# Patient Record
Sex: Female | Born: 1935
Health system: Southern US, Community
[De-identification: ages and names within clinical notes are randomized; demographics above are authoritative.]

## PROBLEM LIST (undated history)

## (undated) DIAGNOSIS — M545 Low back pain, unspecified: Secondary | ICD-10-CM

## (undated) DIAGNOSIS — F32A Depression, unspecified: Secondary | ICD-10-CM

## (undated) DIAGNOSIS — R1013 Epigastric pain: Secondary | ICD-10-CM

## (undated) DIAGNOSIS — R112 Nausea with vomiting, unspecified: Secondary | ICD-10-CM

## (undated) DIAGNOSIS — F419 Anxiety disorder, unspecified: Secondary | ICD-10-CM

## (undated) DIAGNOSIS — G4700 Insomnia, unspecified: Secondary | ICD-10-CM

## (undated) DIAGNOSIS — IMO0002 Reserved for concepts with insufficient information to code with codable children: Secondary | ICD-10-CM

## (undated) DIAGNOSIS — E119 Type 2 diabetes mellitus without complications: Secondary | ICD-10-CM

## (undated) DIAGNOSIS — J309 Allergic rhinitis, unspecified: Secondary | ICD-10-CM

## (undated) DIAGNOSIS — R519 Headache, unspecified: Secondary | ICD-10-CM

## (undated) DIAGNOSIS — I10 Essential (primary) hypertension: Secondary | ICD-10-CM

## (undated) DIAGNOSIS — M109 Gout, unspecified: Secondary | ICD-10-CM

## (undated) DIAGNOSIS — F329 Major depressive disorder, single episode, unspecified: Secondary | ICD-10-CM

## (undated) DIAGNOSIS — Z9289 Personal history of other medical treatment: Secondary | ICD-10-CM

## (undated) DIAGNOSIS — H8090 Unspecified otosclerosis, unspecified ear: Secondary | ICD-10-CM

## (undated) DIAGNOSIS — R51 Headache: Secondary | ICD-10-CM

## (undated) DIAGNOSIS — M5430 Sciatica, unspecified side: Secondary | ICD-10-CM

## (undated) DIAGNOSIS — K5790 Diverticulosis of intestine, part unspecified, without perforation or abscess without bleeding: Secondary | ICD-10-CM

## (undated) DIAGNOSIS — H9313 Tinnitus, bilateral: Secondary | ICD-10-CM

## (undated) DIAGNOSIS — Z9889 Other specified postprocedural states: Secondary | ICD-10-CM

## (undated) HISTORY — PX: STAPEDECTOMY: SHX2435

## (undated) HISTORY — PX: EYE SURGERY: SHX253

## (undated) HISTORY — PX: TOTAL ABDOMINAL HYSTERECTOMY: SHX209

## (undated) HISTORY — DX: Tinnitus, bilateral: H93.13

## (undated) HISTORY — DX: Diverticulosis of intestine, part unspecified, without perforation or abscess without bleeding: K57.90

## (undated) HISTORY — DX: Essential (primary) hypertension: I10

## (undated) HISTORY — PX: CHOLECYSTECTOMY: SHX55

## (undated) HISTORY — DX: Headache, unspecified: R51.9

## (undated) HISTORY — DX: Allergic rhinitis, unspecified: J30.9

## (undated) HISTORY — DX: Low back pain, unspecified: M54.50

## (undated) HISTORY — PX: APPENDECTOMY: SHX54

## (undated) HISTORY — DX: Headache: R51

## (undated) HISTORY — DX: Anxiety disorder, unspecified: F41.9

## (undated) HISTORY — DX: Low back pain: M54.5

## (undated) HISTORY — DX: Gout, unspecified: M10.9

## (undated) HISTORY — DX: Type 2 diabetes mellitus without complications: E11.9

## (undated) HISTORY — DX: Major depressive disorder, single episode, unspecified: F32.9

## (undated) HISTORY — DX: Reserved for concepts with insufficient information to code with codable children: IMO0002

## (undated) HISTORY — DX: Depression, unspecified: F32.A

## (undated) HISTORY — DX: Sciatica, unspecified side: M54.30

## (undated) HISTORY — DX: Unspecified otosclerosis, unspecified ear: H80.90

## (undated) HISTORY — PX: BREAST REDUCTION SURGERY: SHX8

## (undated) HISTORY — PX: TOTAL KNEE ARTHROPLASTY: SHX125

## (undated) HISTORY — DX: Epigastric pain: R10.13

## (undated) HISTORY — DX: Insomnia, unspecified: G47.00

---

## 1998-09-25 ENCOUNTER — Encounter: Payer: Self-pay | Admitting: Emergency Medicine

## 1998-09-25 ENCOUNTER — Emergency Department (HOSPITAL_COMMUNITY): Admission: EM | Admit: 1998-09-25 | Discharge: 1998-09-25 | Payer: Self-pay | Admitting: Emergency Medicine

## 1998-11-01 ENCOUNTER — Ambulatory Visit (HOSPITAL_COMMUNITY): Admission: RE | Admit: 1998-11-01 | Discharge: 1998-11-01 | Payer: Self-pay | Admitting: *Deleted

## 1998-11-01 ENCOUNTER — Encounter: Payer: Self-pay | Admitting: *Deleted

## 1999-02-09 ENCOUNTER — Other Ambulatory Visit: Admission: RE | Admit: 1999-02-09 | Discharge: 1999-02-09 | Payer: Self-pay | Admitting: *Deleted

## 1999-11-09 ENCOUNTER — Encounter: Admission: RE | Admit: 1999-11-09 | Discharge: 1999-11-09 | Payer: Self-pay | Admitting: *Deleted

## 1999-11-09 ENCOUNTER — Encounter: Payer: Self-pay | Admitting: *Deleted

## 2000-11-09 ENCOUNTER — Encounter: Admission: RE | Admit: 2000-11-09 | Discharge: 2000-11-09 | Payer: Self-pay | Admitting: Internal Medicine

## 2000-11-09 ENCOUNTER — Encounter: Payer: Self-pay | Admitting: Internal Medicine

## 2001-11-14 ENCOUNTER — Encounter: Admission: RE | Admit: 2001-11-14 | Discharge: 2001-11-14 | Payer: Self-pay | Admitting: Internal Medicine

## 2001-11-14 ENCOUNTER — Encounter: Payer: Self-pay | Admitting: Internal Medicine

## 2002-09-10 ENCOUNTER — Encounter: Payer: Self-pay | Admitting: Emergency Medicine

## 2002-09-10 ENCOUNTER — Emergency Department (HOSPITAL_COMMUNITY): Admission: EM | Admit: 2002-09-10 | Discharge: 2002-09-10 | Payer: Self-pay | Admitting: Emergency Medicine

## 2002-12-12 ENCOUNTER — Encounter: Admission: RE | Admit: 2002-12-12 | Discharge: 2002-12-12 | Payer: Self-pay | Admitting: Internal Medicine

## 2002-12-12 ENCOUNTER — Encounter: Payer: Self-pay | Admitting: Internal Medicine

## 2003-10-16 ENCOUNTER — Inpatient Hospital Stay (HOSPITAL_COMMUNITY): Admission: RE | Admit: 2003-10-16 | Discharge: 2003-10-21 | Payer: Self-pay | Admitting: Orthopedic Surgery

## 2004-03-02 ENCOUNTER — Encounter: Admission: RE | Admit: 2004-03-02 | Discharge: 2004-03-02 | Payer: Self-pay | Admitting: Internal Medicine

## 2005-04-23 ENCOUNTER — Encounter: Admission: RE | Admit: 2005-04-23 | Discharge: 2005-04-23 | Payer: Self-pay | Admitting: Internal Medicine

## 2006-04-26 ENCOUNTER — Encounter: Admission: RE | Admit: 2006-04-26 | Discharge: 2006-04-26 | Payer: Self-pay | Admitting: Internal Medicine

## 2006-04-26 ENCOUNTER — Encounter: Payer: Self-pay | Admitting: Internal Medicine

## 2006-05-04 ENCOUNTER — Encounter: Admission: RE | Admit: 2006-05-04 | Discharge: 2006-05-04 | Payer: Self-pay | Admitting: Internal Medicine

## 2008-03-05 ENCOUNTER — Encounter: Admission: RE | Admit: 2008-03-05 | Discharge: 2008-03-05 | Payer: Self-pay | Admitting: Internal Medicine

## 2009-06-13 ENCOUNTER — Encounter: Admission: RE | Admit: 2009-06-13 | Discharge: 2009-06-13 | Payer: Self-pay | Admitting: Internal Medicine

## 2010-06-14 ENCOUNTER — Encounter: Payer: Self-pay | Admitting: Internal Medicine

## 2010-10-05 ENCOUNTER — Other Ambulatory Visit: Payer: Self-pay | Admitting: Internal Medicine

## 2010-10-05 DIAGNOSIS — Z1231 Encounter for screening mammogram for malignant neoplasm of breast: Secondary | ICD-10-CM

## 2010-10-09 NOTE — Op Note (Signed)
NAME:  Haley Campbell, Haley Campbell                            ACCOUNT NO.:  [id number]   MEDICAL RECORD NO.:  [id number]                   PATIENT TYPE:  INP   LOCATION:  5025                                 FACILITY:  [facility]   PHYSICIAN:  Elana Alm. Thurston Hole, M.D.              DATE OF BIRTH:  June 22, 1923   DATE OF PROCEDURE:  10/17/2003  DATE OF DISCHARGE:                                 OPERATIVE REPORT   PREOPERATIVE DIAGNOSIS:  Right knee degenerative joint disease.   POSTOPERATIVE DIAGNOSIS:  Right knee degenerative joint disease.   OPERATION PERFORMED:  Right total knee replacement using [employer]ics Scorpio  total knee system with #7 cemented femur, #5 cemented tibia with 12 mm  polyethylene Flex tibial spacer and 26 mm polyethylene cemented patella.   SURGEON:  Elana Alm. Thurston Hole, M.D.   ASSISTANT:  Julien Girt, P.A.   ANESTHESIA:  General.   OPERATIVE TIME:  One hour and 20 minutes.   COMPLICATIONS:  None   DESCRIPTION OF PROCEDURE:  Haley Campbell was brought to the operating room on Oct 16, 2003 after a femoral nerve block had been placed in the holding room by  anesthesia.  She was placed on the operating table in supine position.  She  received Ancef 1 g IV preoperatively for prophylaxis.  After being placed  under general anesthesia, she had a Foley catheter placed under sterile  conditions.  Her right knee was examined under anesthesia.  Range of motion  was -5 to 125 degrees with mild varus deformity.  Knee stable to ligamentous  exam with normal patellar tracking.  The right knee was prepped using  sterile DuraPrep and draped using sterile technique.  The leg was  exsanguinated and a thigh tourniquet elevated to 350 mmHg.  Initially,  through a 15 cm longitudinal incision based over the patella, initial  exposure was made.  The underlying subcutaneous tissues were incised in line  with the skin incision.  A median arthrotomy was performed revealing an  excessive amount of  normal-appearing joint fluid.  The articular surfaces  were inspected.  She had grade 4 changes medially, grade 3 changes laterally  and grade 3 and 4 changes in the patellofemoral joint.  The medial and  lateral meniscal remnants were removed as well as the anterior cruciate  ligament.  Osteophytes were removed from the femoral condyles and tibial  plateau.  Intramedullary drill was then drilled up the femoral canal for  placement of a distal femoral cutting jig which was placed in the  appropriate amount of rotation and a distal 10 mm cut was made.  The distal  femur was then sized.  #7 was found to be the appropriate size.  A #7  cutting jig was placed and then these cuts were made.  The proximal tibia  was then exposed.  The tibial spines were removed with an oscillating saw.  Intramedullary drill,  drilled down the tibial canal for placement of the  proximal tibial cutting jig which was placed in the appropriate amount of  rotation and a proximal 6 mm cut was made.  After this was done, the Scorpio  PCL cutter was placed back on the distal femur and these cuts were made.  At  this point the #7 femoral trial was placed.  The #5 tibial base plate trial  was placed over a 12 mm polyethylene spacer.  There was found to be  excellent restoration of normal alignment and excellent stability.  Range of  motion 0 to 125 degrees.  The tibial base plate was then marked for rotation  and the keel cut was made. After this was done, the patella was sized.  A 26  mm was found to be the appropriate size and a recessed 10 mm x 26 mm cut was  made and three locking holes were placed.  After this was done, it was felt  that all of the trial components were if excellent size fit and stability.  They were then removed and the knee was then jet lavage irrigated with 3 L  of saline solution.  The proximal tibia was then exposed.  A #5 tibial base  plate with cement backing was then hammered in position with an  excellent  fit with excess cement being removed from around the edges. The #7 femoral  component with cement backing was hammered into position, also with cement  backing and excess cement removed from around the edges.  The 12 mm  polyethylene Flex tibial spacer was then locked on the tibial base plate,  knee taken through a range of motion 0 to 125 degrees with excellent  stability, excellent correction of her varus deformity.  The 26 mm  polyethylene cement backed patella was then locked into its recess hole and  held there with a clamp.  After the cement hardened, patellofemoral tracking  was evaluated and this was found to be normal.  At this point it was felt  that all the components were of excellent size, fit and stability.  The knee  was further irrigated with saline and then the arthrotomy was closed with #1  Ethibond suture over two medium Hemovac drains.  Subcutaneous tissue closed  with 0 and 2-0 Vicryl.  Skin closed with skin staples.  Sterile dressings  were applied.  The Hemovac injected with 0.25% Marcaine with epinephrine and  clamped.  Tourniquet was released.  Sterile dressings and a long leg splint  applied.  The patient was awakened, extubated and taken to recovery room in  stable condition.  Sponge and needle counts were correct times two at the  end of this case.                                               Robert A. Thurston Hole, M.D.    RAW/MEDQ  D:  10/17/2003  T:  10/17/2003  Job:  [id number]

## 2010-10-09 NOTE — Discharge Summary (Signed)
NAME:  Haley Campbell, Haley Campbell                            ACCOUNT NO.:  [id number]   MEDICAL RECORD NO.:  [id number]                   PATIENT TYPE:  INP   LOCATION:  5025                                 FACILITY:  [facility]   PHYSICIAN:  Elana Alm. Thurston Hole, M.D.              DATE OF BIRTH:  Aug 04, 1923   DATE OF ADMISSION:  10/16/2003  DATE OF DISCHARGE:  10/21/2003                                 DISCHARGE SUMMARY   ADMISSION DIAGNOSES:  1. End-stage degenerative joint disease, right knee.  2. Hypertension.  3. Glaucoma.   DISCHARGE DIAGNOSES:  1. End-stage degenerative joint disease, right knee.  2. Hypertension.  3. Glaucoma.  4. Postoperative blood loss anemia.   HISTORY OF PRESENT ILLNESS:  The patient is a 77 year old female who has a  history of end-stage degenerative joint disease of her right knee.  She has  tried anti-inflammatories, cortisone injections and conservative care, all  without success.  She understands the risks, benefits and possible  complications of a right total knee replacement, and is without questions.   PROCEDURE IN-HOUSE:  On Oct 16, 2003, the patient underwent a right total  knee replacement by Dr. Elana Alm. Wainer.  Postoperatively she had a right  femoral nerve block by anesthesia.  She tolerated both procedures well.  On  postoperative day one, the patient was doing well.  She did have nausea on  the day before.  Her surgical wound was well-approximated.  He hemoglobin  was 10.3.  Her urinalysis was negative.  On postoperative day two, pain medicine was switched to Dilaudid due to  nausea and increased pain.  She tolerated this better.  Her surgical wound  was well-approximated.  Her hemoglobin was 9.6.  Her Foley catheter was  discontinued.  Her physical therapy was progressed.  On postoperative day three the patient did better on Dilaudid for pain.  Her  hemoglobin was 8.9.  The surgical wound was well-approximated.  A Doppler  was obtained to rule out a  deep vein thrombosis.  The patient requested  Darvocet for pain, despite the fact that Dilaudid was barely controlling her  pain.  She was given this and it did not help.  Her PCA was restarted with  the Dilaudid PCA.  Her hemoglobin was 7.6.  She was transfused 2 units of  packed red blood cells due to her postoperative blood loss anemia.  On postoperative day five the patient was discharged to home on p.o.  Dilaudid for pain.  Her INR was 1.8.  Her hemoglobin was 10.2.  Her surgical  wound was well-approximated.   DISCHARGE MEDICATIONS:  1. Dilaudid 2 mg, one to two q.4h. p.r.n. pain.  2. Robaxin 500 mg, one q.6h. p.r.n. muscle spasm.  3. Coumadin 2 mg, one tab q. Evening.  4. Lisinopril 20 mg, one tab daily.  5. Alprazolam 0.5 mg, one tab q.8h. p.r.n. anxiety.  6. Xalatan  0.0005%, one drop in each eye in the evening.  7. Timoptic 0.5 mg, one drop in each eye in the morning.  8. Colace 100 mg, one tab b.i.d.  9. Senokot S, two tab before dinner.   DISCHARGE INSTRUCTIONS:  1. To never place a pillow under the bend of the knee.  2. Every morning she is to elevate her right heel on a folded pillow for 30     minutes, to work on extension of her knee.  3. She has been instructed to change her dressing every day.  4. She is to use her CPM 0-60 degrees eight hours a day, increasing by 5     degrees a day until she reaches 90 degrees, and then continue using it     eight hours a day.  5. She is weightbearing as tolerated.  6. She is on a regular diet.  7. She is to wear a knee immobilizer when she is walking.  8. She is to call Dr. Thurston Hole if she has increased redness, increased     swelling, increased pain, or a temperature of greater than 101 degrees.   FOLLOWUP:  She will follow up in the office in 1-1/2 weeks, or sooner if she  has problems.      Kirstin Shepperson, P.A.                  Robert A. Thurston Hole, M.D.    KS/MEDQ  D:  12/17/2003  T:  12/17/2003  Job:  [id number]

## 2010-10-14 ENCOUNTER — Ambulatory Visit
Admission: RE | Admit: 2010-10-14 | Discharge: 2010-10-14 | Disposition: A | Discharge status: Home / Self Care | Payer: Medicare Other | Source: Ambulatory Visit | Attending: Internal Medicine | Admitting: Internal Medicine

## 2010-10-14 DIAGNOSIS — Z1231 Encounter for screening mammogram for malignant neoplasm of breast: Secondary | ICD-10-CM

## 2011-06-23 DIAGNOSIS — K5732 Diverticulitis of large intestine without perforation or abscess without bleeding: Secondary | ICD-10-CM | POA: Diagnosis not present

## 2011-06-23 DIAGNOSIS — R1032 Left lower quadrant pain: Secondary | ICD-10-CM | POA: Diagnosis not present

## 2011-06-23 DIAGNOSIS — G4700 Insomnia, unspecified: Secondary | ICD-10-CM | POA: Diagnosis not present

## 2011-06-23 DIAGNOSIS — I10 Essential (primary) hypertension: Secondary | ICD-10-CM | POA: Diagnosis not present

## 2011-06-23 DIAGNOSIS — F411 Generalized anxiety disorder: Secondary | ICD-10-CM | POA: Diagnosis not present

## 2011-06-23 DIAGNOSIS — E559 Vitamin D deficiency, unspecified: Secondary | ICD-10-CM | POA: Diagnosis not present

## 2011-06-23 DIAGNOSIS — H9190 Unspecified hearing loss, unspecified ear: Secondary | ICD-10-CM | POA: Diagnosis not present

## 2011-06-30 DIAGNOSIS — H6120 Impacted cerumen, unspecified ear: Secondary | ICD-10-CM | POA: Diagnosis not present

## 2011-06-30 DIAGNOSIS — H908 Mixed conductive and sensorineural hearing loss, unspecified: Secondary | ICD-10-CM | POA: Diagnosis not present

## 2011-06-30 DIAGNOSIS — H9319 Tinnitus, unspecified ear: Secondary | ICD-10-CM | POA: Diagnosis not present

## 2011-07-28 DIAGNOSIS — H409 Unspecified glaucoma: Secondary | ICD-10-CM | POA: Diagnosis not present

## 2011-07-28 DIAGNOSIS — H4011X0 Primary open-angle glaucoma, stage unspecified: Secondary | ICD-10-CM | POA: Diagnosis not present

## 2011-08-18 DIAGNOSIS — I10 Essential (primary) hypertension: Secondary | ICD-10-CM | POA: Diagnosis not present

## 2011-08-18 DIAGNOSIS — G4700 Insomnia, unspecified: Secondary | ICD-10-CM | POA: Diagnosis not present

## 2011-08-18 DIAGNOSIS — E559 Vitamin D deficiency, unspecified: Secondary | ICD-10-CM | POA: Diagnosis not present

## 2011-08-18 DIAGNOSIS — J309 Allergic rhinitis, unspecified: Secondary | ICD-10-CM | POA: Diagnosis not present

## 2011-08-18 DIAGNOSIS — M76899 Other specified enthesopathies of unspecified lower limb, excluding foot: Secondary | ICD-10-CM | POA: Diagnosis not present

## 2011-08-18 DIAGNOSIS — H9190 Unspecified hearing loss, unspecified ear: Secondary | ICD-10-CM | POA: Diagnosis not present

## 2011-08-18 DIAGNOSIS — F411 Generalized anxiety disorder: Secondary | ICD-10-CM | POA: Diagnosis not present

## 2012-01-13 DIAGNOSIS — H4011X0 Primary open-angle glaucoma, stage unspecified: Secondary | ICD-10-CM | POA: Diagnosis not present

## 2012-01-13 DIAGNOSIS — H409 Unspecified glaucoma: Secondary | ICD-10-CM | POA: Diagnosis not present

## 2012-02-09 DIAGNOSIS — H4011X0 Primary open-angle glaucoma, stage unspecified: Secondary | ICD-10-CM | POA: Diagnosis not present

## 2012-02-09 DIAGNOSIS — H409 Unspecified glaucoma: Secondary | ICD-10-CM | POA: Diagnosis not present

## 2012-02-10 DIAGNOSIS — Z23 Encounter for immunization: Secondary | ICD-10-CM | POA: Diagnosis not present

## 2012-02-21 DIAGNOSIS — G4700 Insomnia, unspecified: Secondary | ICD-10-CM | POA: Diagnosis not present

## 2012-02-21 DIAGNOSIS — J3489 Other specified disorders of nose and nasal sinuses: Secondary | ICD-10-CM | POA: Diagnosis not present

## 2012-02-21 DIAGNOSIS — M1710 Unilateral primary osteoarthritis, unspecified knee: Secondary | ICD-10-CM | POA: Diagnosis not present

## 2012-02-21 DIAGNOSIS — Z79899 Other long term (current) drug therapy: Secondary | ICD-10-CM | POA: Diagnosis not present

## 2012-02-21 DIAGNOSIS — I10 Essential (primary) hypertension: Secondary | ICD-10-CM | POA: Diagnosis not present

## 2012-02-21 DIAGNOSIS — M169 Osteoarthritis of hip, unspecified: Secondary | ICD-10-CM | POA: Diagnosis not present

## 2012-02-21 DIAGNOSIS — E559 Vitamin D deficiency, unspecified: Secondary | ICD-10-CM | POA: Diagnosis not present

## 2012-02-21 DIAGNOSIS — J309 Allergic rhinitis, unspecified: Secondary | ICD-10-CM | POA: Diagnosis not present

## 2012-03-30 DIAGNOSIS — H902 Conductive hearing loss, unspecified: Secondary | ICD-10-CM | POA: Diagnosis not present

## 2012-03-30 DIAGNOSIS — H903 Sensorineural hearing loss, bilateral: Secondary | ICD-10-CM | POA: Diagnosis not present

## 2012-04-27 DIAGNOSIS — Z79899 Other long term (current) drug therapy: Secondary | ICD-10-CM | POA: Diagnosis not present

## 2012-04-27 DIAGNOSIS — H9319 Tinnitus, unspecified ear: Secondary | ICD-10-CM | POA: Diagnosis not present

## 2012-04-27 DIAGNOSIS — H9190 Unspecified hearing loss, unspecified ear: Secondary | ICD-10-CM | POA: Diagnosis not present

## 2012-05-30 DIAGNOSIS — F411 Generalized anxiety disorder: Secondary | ICD-10-CM | POA: Diagnosis not present

## 2012-05-30 DIAGNOSIS — M5430 Sciatica, unspecified side: Secondary | ICD-10-CM | POA: Diagnosis not present

## 2012-05-30 DIAGNOSIS — G4700 Insomnia, unspecified: Secondary | ICD-10-CM | POA: Diagnosis not present

## 2012-05-30 DIAGNOSIS — I10 Essential (primary) hypertension: Secondary | ICD-10-CM | POA: Diagnosis not present

## 2012-08-07 DIAGNOSIS — Z0000 Encounter for general adult medical examination without abnormal findings: Secondary | ICD-10-CM | POA: Diagnosis not present

## 2012-08-07 DIAGNOSIS — I10 Essential (primary) hypertension: Secondary | ICD-10-CM | POA: Diagnosis not present

## 2012-09-08 DIAGNOSIS — S62319A Displaced fracture of base of unspecified metacarpal bone, initial encounter for closed fracture: Secondary | ICD-10-CM | POA: Diagnosis not present

## 2012-09-08 DIAGNOSIS — S139XXA Sprain of joints and ligaments of unspecified parts of neck, initial encounter: Secondary | ICD-10-CM | POA: Diagnosis not present

## 2012-09-08 DIAGNOSIS — S025XXA Fracture of tooth (traumatic), initial encounter for closed fracture: Secondary | ICD-10-CM | POA: Diagnosis not present

## 2012-09-08 DIAGNOSIS — S199XXA Unspecified injury of neck, initial encounter: Secondary | ICD-10-CM | POA: Diagnosis not present

## 2012-09-08 DIAGNOSIS — S62309A Unspecified fracture of unspecified metacarpal bone, initial encounter for closed fracture: Secondary | ICD-10-CM | POA: Diagnosis not present

## 2012-09-08 DIAGNOSIS — S01501A Unspecified open wound of lip, initial encounter: Secondary | ICD-10-CM | POA: Diagnosis not present

## 2012-09-08 DIAGNOSIS — S0990XA Unspecified injury of head, initial encounter: Secondary | ICD-10-CM | POA: Diagnosis not present

## 2012-09-08 DIAGNOSIS — S0003XA Contusion of scalp, initial encounter: Secondary | ICD-10-CM | POA: Diagnosis not present

## 2012-09-08 DIAGNOSIS — S1093XA Contusion of unspecified part of neck, initial encounter: Secondary | ICD-10-CM | POA: Diagnosis not present

## 2012-09-08 DIAGNOSIS — S0993XA Unspecified injury of face, initial encounter: Secondary | ICD-10-CM | POA: Diagnosis not present

## 2012-09-11 DIAGNOSIS — S62309A Unspecified fracture of unspecified metacarpal bone, initial encounter for closed fracture: Secondary | ICD-10-CM | POA: Diagnosis not present

## 2012-09-21 DIAGNOSIS — IMO0001 Reserved for inherently not codable concepts without codable children: Secondary | ICD-10-CM | POA: Diagnosis not present

## 2012-09-27 DIAGNOSIS — H409 Unspecified glaucoma: Secondary | ICD-10-CM | POA: Diagnosis not present

## 2012-10-05 DIAGNOSIS — IMO0001 Reserved for inherently not codable concepts without codable children: Secondary | ICD-10-CM | POA: Diagnosis not present

## 2012-10-19 DIAGNOSIS — S5290XD Unspecified fracture of unspecified forearm, subsequent encounter for closed fracture with routine healing: Secondary | ICD-10-CM | POA: Diagnosis not present

## 2012-11-02 DIAGNOSIS — Z4789 Encounter for other orthopedic aftercare: Secondary | ICD-10-CM | POA: Diagnosis not present

## 2012-11-08 DIAGNOSIS — S62319A Displaced fracture of base of unspecified metacarpal bone, initial encounter for closed fracture: Secondary | ICD-10-CM | POA: Diagnosis not present

## 2012-11-15 DIAGNOSIS — S62319A Displaced fracture of base of unspecified metacarpal bone, initial encounter for closed fracture: Secondary | ICD-10-CM | POA: Diagnosis not present

## 2012-11-20 DIAGNOSIS — S62319A Displaced fracture of base of unspecified metacarpal bone, initial encounter for closed fracture: Secondary | ICD-10-CM | POA: Diagnosis not present

## 2012-11-28 DIAGNOSIS — S62319A Displaced fracture of base of unspecified metacarpal bone, initial encounter for closed fracture: Secondary | ICD-10-CM | POA: Diagnosis not present

## 2013-02-07 DIAGNOSIS — R51 Headache: Secondary | ICD-10-CM | POA: Diagnosis not present

## 2013-02-07 DIAGNOSIS — H8000 Otosclerosis involving oval window, nonobliterative, unspecified ear: Secondary | ICD-10-CM | POA: Diagnosis not present

## 2013-02-07 DIAGNOSIS — F411 Generalized anxiety disorder: Secondary | ICD-10-CM | POA: Diagnosis not present

## 2013-02-07 DIAGNOSIS — M5430 Sciatica, unspecified side: Secondary | ICD-10-CM | POA: Diagnosis not present

## 2013-02-13 DIAGNOSIS — Z23 Encounter for immunization: Secondary | ICD-10-CM | POA: Diagnosis not present

## 2013-03-09 DIAGNOSIS — H8000 Otosclerosis involving oval window, nonobliterative, unspecified ear: Secondary | ICD-10-CM | POA: Diagnosis not present

## 2013-03-09 DIAGNOSIS — M76899 Other specified enthesopathies of unspecified lower limb, excluding foot: Secondary | ICD-10-CM | POA: Diagnosis not present

## 2013-03-09 DIAGNOSIS — M5430 Sciatica, unspecified side: Secondary | ICD-10-CM | POA: Diagnosis not present

## 2013-03-09 DIAGNOSIS — F411 Generalized anxiety disorder: Secondary | ICD-10-CM | POA: Diagnosis not present

## 2013-03-09 DIAGNOSIS — R51 Headache: Secondary | ICD-10-CM | POA: Diagnosis not present

## 2013-03-22 DIAGNOSIS — H902 Conductive hearing loss, unspecified: Secondary | ICD-10-CM | POA: Diagnosis not present

## 2013-03-22 DIAGNOSIS — H9319 Tinnitus, unspecified ear: Secondary | ICD-10-CM | POA: Diagnosis not present

## 2013-03-28 DIAGNOSIS — H47239 Glaucomatous optic atrophy, unspecified eye: Secondary | ICD-10-CM | POA: Diagnosis not present

## 2013-03-28 DIAGNOSIS — H409 Unspecified glaucoma: Secondary | ICD-10-CM | POA: Diagnosis not present

## 2013-06-07 DIAGNOSIS — J111 Influenza due to unidentified influenza virus with other respiratory manifestations: Secondary | ICD-10-CM | POA: Diagnosis not present

## 2013-08-13 DIAGNOSIS — Z1331 Encounter for screening for depression: Secondary | ICD-10-CM | POA: Diagnosis not present

## 2013-08-13 DIAGNOSIS — IMO0002 Reserved for concepts with insufficient information to code with codable children: Secondary | ICD-10-CM | POA: Diagnosis not present

## 2013-08-13 DIAGNOSIS — G4700 Insomnia, unspecified: Secondary | ICD-10-CM | POA: Diagnosis not present

## 2013-08-13 DIAGNOSIS — E119 Type 2 diabetes mellitus without complications: Secondary | ICD-10-CM | POA: Diagnosis not present

## 2013-08-13 DIAGNOSIS — M1710 Unilateral primary osteoarthritis, unspecified knee: Secondary | ICD-10-CM | POA: Diagnosis not present

## 2013-08-13 DIAGNOSIS — M109 Gout, unspecified: Secondary | ICD-10-CM | POA: Diagnosis not present

## 2013-08-13 DIAGNOSIS — Z0000 Encounter for general adult medical examination without abnormal findings: Secondary | ICD-10-CM | POA: Diagnosis not present

## 2013-08-13 DIAGNOSIS — R1032 Left lower quadrant pain: Secondary | ICD-10-CM | POA: Diagnosis not present

## 2013-08-13 DIAGNOSIS — E559 Vitamin D deficiency, unspecified: Secondary | ICD-10-CM | POA: Diagnosis not present

## 2013-08-13 DIAGNOSIS — I10 Essential (primary) hypertension: Secondary | ICD-10-CM | POA: Diagnosis not present

## 2013-08-13 DIAGNOSIS — H9190 Unspecified hearing loss, unspecified ear: Secondary | ICD-10-CM | POA: Diagnosis not present

## 2013-08-20 DIAGNOSIS — M545 Low back pain, unspecified: Secondary | ICD-10-CM | POA: Diagnosis not present

## 2013-08-20 DIAGNOSIS — M169 Osteoarthritis of hip, unspecified: Secondary | ICD-10-CM | POA: Diagnosis not present

## 2013-08-20 DIAGNOSIS — M1610 Unilateral primary osteoarthritis, unspecified hip: Secondary | ICD-10-CM | POA: Diagnosis not present

## 2013-08-30 DIAGNOSIS — M25559 Pain in unspecified hip: Secondary | ICD-10-CM | POA: Diagnosis not present

## 2013-10-04 DIAGNOSIS — M25559 Pain in unspecified hip: Secondary | ICD-10-CM | POA: Diagnosis not present

## 2013-10-22 ENCOUNTER — Encounter: Payer: Self-pay | Admitting: *Deleted

## 2013-10-23 ENCOUNTER — Ambulatory Visit (INDEPENDENT_AMBULATORY_CARE_PROVIDER_SITE_OTHER): Payer: Medicare Other | Admitting: Neurology

## 2013-10-23 ENCOUNTER — Encounter: Payer: Self-pay | Admitting: Neurology

## 2013-10-23 VITALS — BP 159/81 | HR 74 | Ht 62.75 in | Wt 150.0 lb

## 2013-10-23 DIAGNOSIS — H9190 Unspecified hearing loss, unspecified ear: Secondary | ICD-10-CM | POA: Diagnosis not present

## 2013-10-23 DIAGNOSIS — R51 Headache: Secondary | ICD-10-CM | POA: Diagnosis not present

## 2013-10-23 DIAGNOSIS — E538 Deficiency of other specified B group vitamins: Secondary | ICD-10-CM | POA: Diagnosis not present

## 2013-10-23 DIAGNOSIS — G459 Transient cerebral ischemic attack, unspecified: Secondary | ICD-10-CM | POA: Diagnosis not present

## 2013-10-23 DIAGNOSIS — R519 Headache, unspecified: Secondary | ICD-10-CM | POA: Insufficient documentation

## 2013-10-23 NOTE — Progress Notes (Signed)
[facility]    Provider:  Dr Hosie PoissonSumner Referring Provider: Marden NobleGates, Robert, MD Primary Care Physician:  Pearla DubonnetGATES,ROBERT NEVILL, MD  CC:  Blowing in her ears  HPI:  Haley GalloDoris G Campbell is a 80 y.o. female here as a referral from Dr. Kevan NyGates for headache and blowing sound in her ears  Symptoms started around 3 years ago. Symptoms start as a burning sensation on the top of her head, then develops a popping sound in her left ear and then gets a loud blowing sound that can continue for up to 24 hours, will resolve when she wakes up. Sleep is the only thing that gets rid of it. Events last a day and then will reoccur 2 days later, is on a set schedule for the past few years. After the episode can get a severe frontal squeezing headache. Has glaucoma and baseline poor visual acuity. In the past has had episodes of vertigo but these are brief and not tied to the above episodes.   Reports having had 3 MRI of the brain in the past which were normal. Has been evaluated by multiple ENT physicians in the past with no cause. Otherwise healthy.   Review of Systems: Out of a complete 14 system review, the patient complains of only the following symptoms, and all other reviewed systems are negative. + blurred vision, loss of vision, headache, insomnia, sleepiness, restless legs, difficulty swallowing  History   Social History  . Marital Status: Widowed    Spouse Name: N/A    Number of Children: N/A  . Years of Education: N/A   Occupational History  . Not on file.   Social History Main Topics  . Smoking status: Never Smoker   . Smokeless tobacco: Never Used  . Alcohol Use: No  . Drug Use: No  . Sexual Activity: Not on file   Other Topics Concern  . Not on file   Social History Narrative   Widowed, 2 children   Right handed   12 th grade   No caffeine    Family History  Problem Relation Age of Onset  . CAD Father   . CVA Mother   . Hypertension Brother   . Prostate cancer  Brother   . Cancer Sister     BRAIN TUMOR    Past Medical History  Diagnosis Date  . Tinnitus of both ears     Chronic severe, and mixed conductive and sensorineural hearing loss  . Hypertension   . Diverticulosis     with episodic diverticulitis  . Low back pain     Chronic  . Hypertension   . Insomnia     severe  . Gout   . Anxiety   . Depression   . HA (headache)     chronic  . Dyspepsia     persistent  . Degenerative disc disease     knees  . Allergic rhinitis   . Diabetes mellitus     type 2  . Otosclerosis     with left stapedectomy  . Sciatica     left lower extremity     Past Surgical History  Procedure Laterality Date  . Stapedectomy Left     middle ear  . Total knee arthroplasty Right   . Cholecystectomy    . Total abdominal hysterectomy    . Appendectomy    . Breast reduction surgery Bilateral     Current Outpatient Prescriptions  Medication Sig Dispense Refill  . ALPRAZolam (XANAX) 0.5 MG  tablet Take 0.5 mg by mouth at bedtime as needed for anxiety.      Marland Kitchen glucose blood test strip 1 each by Other route as needed for other. Use as instructed      . lisinopril (PRINIVIL,ZESTRIL) 10 MG tablet Take 10 mg by mouth daily.      Marland Kitchen METFORMIN HCL PO Take 500 mg by mouth.      . traMADol-acetaminophen (ULTRACET) 37.5-325 MG per tablet Take 1 tablet by mouth every 6 (six) hours as needed.      . zolpidem (AMBIEN) 10 MG tablet Take 10 mg by mouth at bedtime as needed for sleep.       No current facility-administered medications for this visit.    Allergies as of 10/23/2013 - Review Complete 10/23/2013  Allergen Reaction Noted  . Latex Other (See Comments) 10/22/2013  . Codeine Nausea Only 10/22/2013  . Colchicine Diarrhea and Nausea And Vomiting 10/22/2013  . Hctz [hydrochlorothiazide] Nausea Only and Other (See Comments) 10/22/2013  . Sonata [zaleplon] Nausea Only and Other (See Comments) 10/22/2013  . Sulfa antibiotics Rash 10/22/2013     Vitals: BP 159/81  Pulse 74  Ht 5' 2.75" (1.594 m)  Wt 150 lb (68.04 kg)  BMI 26.78 kg/m2 Last Weight:  Wt Readings from Last 1 Encounters:  10/23/13 150 lb (68.04 kg)   Last Height:   Ht Readings from Last 1 Encounters:  10/23/13 5' 2.75" (1.594 m)     Physical exam: Exam: Gen: NAD, conversant Eyes: anicteric sclerae, moist conjunctivae HENT: Atraumatic, oropharynx clear Neck: Trachea midline; supple,  Lungs: CTA, no wheezing, rales, rhonic                          CV: RRR, no MRG Abdomen: Soft, non-tender;  Extremities: No peripheral edema  Skin: Normal temperature, no rash,  Psych: Appropriate affect, pleasant  Neuro: MS: AA&Ox3, appropriately interactive, normal affect   Attention: WORLD backwards  Speech: fluent w/o paraphasic error  Memory: good recent and remote recall  CN: PERRL, EOMI no nystagmus, no ptosis, sensation intact to LT V1-V3 bilat, face symmetric, no weakness, palate elevates symmetrically, shoulder shrug 5/5 bilat,  tongue protrudes midline, no fasiculations noted.  Motor: normal bulk and tone Strength: 5/5  In all extremities  Coord: rapid alternating and point-to-point (FNF, HTS) movements intact.  Reflexes: symmetrical, bilat downgoing toes  Sens: LT intact in all extremities  Gait: posture, stance, stride and arm-swing normal. Tandem gait intact. Able to walk on heels and toes. Romberg absent.   Assessment:  After physical and neurologic examination, review of laboratory studies, imaging, neurophysiology testing and pre-existing records, assessment will be reviewed on the problem list.  Plan:  Treatment plan and additional workup will be reviewed under Problem List.  1)Headache 2)Abnormal sounds  80y/o woman presenting for initial evaluation of recurrent headaches and an abnormal "whooshing" sound in her ears. Unclear etiology of her symptoms. She has had multiple brain MRIs and ENT evaluations without a clear diagnosis.  New onset headache in a patient in their 75s raises question of GCA though this is atypical for GCA. Will check MRA brain, EEG and lab workup. Follow up once workup has been completed.    Elspeth Cho, DO  [facility] [address] [place], [place] [address]  Phone [phone] Fax [phone]

## 2013-10-23 NOTE — Addendum Note (Signed)
Addended by: Ramond Marrow on: 10/23/2013 01:46 PM   Modules accepted: Orders

## 2013-10-23 NOTE — Patient Instructions (Signed)
Overall you are doing fairly well but I do want to suggest a few things today:   As far as diagnostic testing:  1)Please have some blood work completed today 2)Please schedule an EEG for Thursday of this week 3)I would like you to have a MRA of the brain, you will be called to schedule this  Follow up once workup completed. Please call us with any interim questions, concerns, problems, updates or refill requests.   My clinical assistant and will answer any of your questions and relay your messages to me and also relay most of my messages to you.   Our phone number is [phone]. We also have an after hours call service for urgent matters and there is a physician on-call for urgent questions. For any emergencies you know to call 911 or go to the nearest emergency room

## 2013-10-24 LAB — VITAMIN B12: VITAMIN B 12: 324 pg/mL (ref 211–946)

## 2013-10-24 LAB — SEDIMENTATION RATE: Sed Rate: 22 mm/hr (ref 0–40)

## 2013-10-24 NOTE — Progress Notes (Signed)
Quick Note:  Spoke to patient informed her of normal labs, and to call back with any questions or concerns. ______

## 2013-10-25 ENCOUNTER — Ambulatory Visit (INDEPENDENT_AMBULATORY_CARE_PROVIDER_SITE_OTHER): Payer: Medicare Other | Admitting: Radiology

## 2013-10-25 DIAGNOSIS — R51 Headache: Secondary | ICD-10-CM

## 2013-10-25 DIAGNOSIS — H9190 Unspecified hearing loss, unspecified ear: Secondary | ICD-10-CM

## 2013-10-25 LAB — METHYLMALONIC ACID, SERUM: Methylmalonic Acid: 197 nmol/L (ref 0–378)

## 2013-10-25 NOTE — Procedures (Signed)
    History:  Haley Campbell is an 80 year old patient with a history of headache and a blowing sound in her ears. The patient is being evaluated for this event. The patient has had episodes over the last 3 years.  This is a routine EEG. No skull defects are noted. Medications include Xanax, lisinopril, metformin, Ultracet, and Ambien.   EEG classification: Normal awake  Description of the recording: The background rhythms of this recording consists of a fairly well modulated medium amplitude alpha rhythm of 9 Hz that is reactive to eye opening and closure. As the record progresses, the patient appears to remain in the waking state throughout the recording. Photic stimulation was performed, resulting in a bilateral and symmetric photic driving response. Hyperventilation was not performed. At no time during the recording does there appear to be evidence of spike or spike wave discharges or evidence of focal slowing. Throughout the recording, there is an overlying beta frequency activity. EKG monitor shows no evidence of cardiac rhythm abnormalities with a heart rate of 78.  Impression: This is a normal EEG recording in the waking state. No evidence of ictal or interictal discharges are seen. The persistent beta activity throughout the recording likely is an associated medication effect, likely associated with the use of benzodiazepines such as Xanax.

## 2013-10-26 NOTE — Progress Notes (Signed)
Quick Note:  Spoke with patient and informed her of normal EEG patient had no further questions or concerns and expressed understanding. ______

## 2013-11-03 ENCOUNTER — Ambulatory Visit
Admission: RE | Admit: 2013-11-03 | Discharge: 2013-11-03 | Disposition: A | Discharge status: Home / Self Care | Payer: Medicare Other | Source: Ambulatory Visit | Attending: Neurology | Admitting: Neurology

## 2013-11-03 DIAGNOSIS — H9190 Unspecified hearing loss, unspecified ear: Secondary | ICD-10-CM

## 2013-11-03 DIAGNOSIS — R51 Headache: Secondary | ICD-10-CM | POA: Diagnosis not present

## 2013-11-03 DIAGNOSIS — G459 Transient cerebral ischemic attack, unspecified: Secondary | ICD-10-CM

## 2013-11-06 ENCOUNTER — Telehealth: Payer: Self-pay | Admitting: Neurology

## 2013-11-06 NOTE — Telephone Encounter (Signed)
Pt's daughter Alvis LemmingsDawn called requesting if Dr. Hosie PoissonSumner could call her back concerning pt's MRI results. Thanks

## 2013-11-07 ENCOUNTER — Telehealth: Payer: Self-pay | Admitting: *Deleted

## 2013-11-07 NOTE — Telephone Encounter (Signed)
Called patient and scheduled her f/u appointment for 12/26/13 at 2:30 pm

## 2013-11-07 NOTE — Progress Notes (Signed)
Quick Note:  Spoke with patient and informed her of normal MRI, patient wanted to know when you wanted to see her back, states per last visit you would talk after test completed. Please advise. ______

## 2013-11-07 NOTE — Telephone Encounter (Signed)
Called pt and pt stated that someone called today, informing her of her MRI results. I advised the pt that if she has any other problems, questions or concerns to call the office. Pt verbalized understanding.

## 2013-11-07 NOTE — Progress Notes (Signed)
Quick Note:  Spoke with patient and she is scheduled for 12/26/13 at 2:30 pm. ______

## 2013-12-20 ENCOUNTER — Telehealth: Payer: Self-pay | Admitting: Neurology

## 2013-12-20 NOTE — Telephone Encounter (Signed)
She can be scheduled for the 1130 slot tomorrow. Thanks.

## 2013-12-20 NOTE — Telephone Encounter (Signed)
Daughters requesting an earlier appt with Dr. Hosie PoissonSumner, pt experiencing roaring sound in head.  Daughter states it's lasting longer than normal and pt having a fit.  Please return call and advise.  Can call anytime today and if not available may leave message.

## 2013-12-20 NOTE — Telephone Encounter (Signed)
Called and spoke with daughter(Dawn), scheduled and confirmed appt for tomorrow at 11:30

## 2013-12-20 NOTE — Telephone Encounter (Signed)
Pt requesting appt today for the roaring in her head.  She has had for 3 days now.  Driving her crazy.  Last time she had injection given to her by pcp (anxiety).  Has appt 12-26-13 with you.  See sooner? Or other?    Daughter calling wk # [phone], or [phone].

## 2013-12-21 ENCOUNTER — Ambulatory Visit (INDEPENDENT_AMBULATORY_CARE_PROVIDER_SITE_OTHER): Payer: Medicare Other | Admitting: Neurology

## 2013-12-21 ENCOUNTER — Ambulatory Visit
Admission: RE | Admit: 2013-12-21 | Discharge: 2013-12-21 | Disposition: A | Discharge status: Home / Self Care | Payer: Medicare Other | Source: Ambulatory Visit | Attending: Neurology | Admitting: Neurology

## 2013-12-21 ENCOUNTER — Encounter: Payer: Self-pay | Admitting: Neurology

## 2013-12-21 VITALS — BP 120/70 | HR 78 | Ht 63.0 in | Wt 149.0 lb

## 2013-12-21 DIAGNOSIS — R51 Headache: Secondary | ICD-10-CM

## 2013-12-21 DIAGNOSIS — M47812 Spondylosis without myelopathy or radiculopathy, cervical region: Secondary | ICD-10-CM | POA: Diagnosis not present

## 2013-12-21 DIAGNOSIS — G459 Transient cerebral ischemic attack, unspecified: Secondary | ICD-10-CM

## 2013-12-21 MED ORDER — GABAPENTIN 300 MG PO CAPS: 300.0000 mg | ORAL_CAPSULE | Freq: Two times a day (BID) | ORAL | Status: DC

## 2013-12-21 NOTE — Patient Instructions (Signed)
Overall you are doing fairly well but I do want to suggest a few things today:   Remember to drink plenty of fluid, eat healthy meals and do not skip any meals. Try to eat protein with a every meal and eat a healthy snack such as fruit or nuts in between meals. Try to keep a regular sleep-wake schedule and try to exercise daily, particularly in the form of walking, 20-30 minutes a day, if you can.   As far as your medications are concerned, I would like to suggest the following: 1)Please start Gabapentin 300mg  twice a day  I would like you to have a x-ray of the cervical spine. You will be called to schedule this  [facility] You will be called to schedule this  Please call us with any interim questions, concerns, problems, updates or refill requests.   My clinical assistant and will answer any of your questions and relay your messages to me and also relay most of my messages to you.   Our phone number is [phone]. We also have an after hours call service for urgent matters and there is a physician on-call for urgent questions. For any emergencies you know to call 911 or go to the nearest emergency room

## 2013-12-21 NOTE — Progress Notes (Signed)
[facility]    Provider:  Dr Janann Colonel Referring Provider: Josetta Huddle, MD Primary Care Physician:  Henrine Screws, MD  CC:  Blowing in her ears  HPI:  Haley Campbell is a 80 y.o. female here as a follow up from Dr. Inda Merlin for headache and blowing sound in her ears. Since last visit she has had an unremarkable MRA of the head. Returns today with continued episodes. Describes it as initially a burning sensation on the top of her head, a popping sound in her left ear and a then a loud blowing sound that can continue for >24hours.   Initial visit 10/2013: Symptoms started around 3 years ago. Symptoms start as a burning sensation on the top of her head, then develops a popping sound in her left ear and then gets a loud blowing sound that can continue for up to 24 hours, will resolve when she wakes up. Sleep is the only thing that gets rid of it. Events last a day and then will reoccur 2 days later, is on a set schedule for the past few years. After the episode can get a severe frontal squeezing headache. Has glaucoma and baseline poor visual acuity. In the past has had episodes of vertigo but these are brief and not tied to the above episodes.   Reports having had 3 MRI of the brain in the past which were normal per the patient. Has been evaluated by multiple ENT physicians in the past with no cause. Otherwise healthy.   Review of Systems: Out of a complete 14 system review, the patient complains of only the following symptoms, and all other reviewed systems are negative. + blurred vision, loss of vision, headache, insomnia, sleepiness, restless legs, difficulty swallowing  History   Social History  . Marital Status: Widowed    Spouse Name: N/A    Number of Children: 2  . Years of Education: 12   Occupational History  . Retired     Social History Main Topics  . Smoking status: Never Smoker   . Smokeless tobacco: Never Used  . Alcohol Use: No  . Drug Use: No  .  Sexual Activity: Not on file   Other Topics Concern  . Not on file   Social History Narrative   Widowed, 2 children   Right handed   12 th grade   No caffeine    Family History  Problem Relation Age of Onset  . CAD Father   . CVA Mother   . Hypertension Brother   . Prostate cancer Brother   . Cancer Sister     BRAIN TUMOR    Past Medical History  Diagnosis Date  . Tinnitus of both ears     Chronic severe, and mixed conductive and sensorineural hearing loss  . Hypertension   . Diverticulosis     with episodic diverticulitis  . Low back pain     Chronic  . Hypertension   . Insomnia     severe  . Gout   . Anxiety   . Depression   . HA (headache)     chronic  . Dyspepsia     persistent  . Degenerative disc disease     knees  . Allergic rhinitis   . Diabetes mellitus     type 2  . Otosclerosis     with left stapedectomy  . Sciatica     left lower extremity     Past Surgical History  Procedure Laterality Date  .  Stapedectomy Left     middle ear  . Total knee arthroplasty Right   . Cholecystectomy    . Total abdominal hysterectomy    . Appendectomy    . Breast reduction surgery Bilateral     Current Outpatient Prescriptions  Medication Sig Dispense Refill  . ALPRAZolam (XANAX) 0.5 MG tablet Take 0.5 mg by mouth at bedtime as needed for anxiety.      Marland Kitchen glucose blood test strip 1 each by Other route as needed for other. Use as instructed      . latanoprost (XALATAN) 0.005 % ophthalmic solution Frequency:QD   Dosage:0.0     Instructions:  Note:Dose: 0.005 %      . lisinopril (PRINIVIL,ZESTRIL) 10 MG tablet Take 10 mg by mouth daily.      Marland Kitchen METFORMIN HCL PO Take 500 mg by mouth.      . traMADol-acetaminophen (ULTRACET) 37.5-325 MG per tablet Take 1 tablet by mouth every 6 (six) hours as needed.      . zolpidem (AMBIEN) 10 MG tablet Take 10 mg by mouth at bedtime as needed for sleep.       No current facility-administered medications for this visit.     Allergies as of 12/21/2013 - Review Complete 12/21/2013  Allergen Reaction Noted  . Latex Other (See Comments) 10/22/2013  . Codeine Nausea Only 10/22/2013  . Colchicine Diarrhea and Nausea And Vomiting 10/22/2013  . Hctz [hydrochlorothiazide] Nausea Only and Other (See Comments) 10/22/2013  . Meperidine  12/21/2013  . Sonata [zaleplon] Nausea Only and Other (See Comments) 10/22/2013  . Sulfa antibiotics Rash 10/22/2013    Vitals: BP 120/70  Pulse 78  Ht 5\' 3"  (1.6 m)  Wt 149 lb (67.586 kg)  BMI 26.40 kg/m2 Last Weight:  Wt Readings from Last 1 Encounters:  12/21/13 149 lb (67.586 kg)   Last Height:   Ht Readings from Last 1 Encounters:  12/21/13 5\' 3"  (1.6 m)     Physical exam: Exam: Gen: NAD, conversant Eyes: anicteric sclerae, moist conjunctivae HENT: Atraumatic, oropharynx clear Neck: Trachea midline; supple,  Lungs: CTA, no wheezing, rales, rhonic                          CV: RRR, no MRG Abdomen: Soft, non-tender;  Extremities: No peripheral edema  Skin: Normal temperature, no rash,  Psych: Appropriate affect, pleasant  Neuro: MS: AA&Ox3, appropriately interactive, normal affect   Attention: WORLD backwards  Speech: fluent w/o paraphasic error  Memory: good recent and remote recall  CN: PERRL, EOMI no nystagmus, no ptosis, sensation intact to LT V1-V3 bilat, face symmetric, no weakness, palate elevates symmetrically, shoulder shrug 5/5 bilat,  tongue protrudes midline, no fasiculations noted.  Motor: normal bulk and tone Strength: 5/5  In all extremities  Coord: rapid alternating and point-to-point (FNF, HTS) movements intact.  Reflexes: symmetrical, bilat downgoing toes  Sens: LT intact in all extremities  Gait: posture, stance, stride and arm-swing normal. Tandem gait intact. Able to walk on heels and toes. Romberg absent.   Assessment:  After physical and neurologic examination, review of laboratory studies, imaging, neurophysiology  testing and pre-existing records, assessment will be reviewed on the problem list.  Plan:  Treatment plan and additional workup will be reviewed under Problem List.  1)Headache 2)Abnormal sounds  80y/o woman presenting for follow up evaluation of recurrent headaches and an abnormal "whooshing" sound in her ears. Unclear etiology of her symptoms. She has had multiple brain MRIs  and ENT evaluations without a clear diagnosis. ESR was negative. MRA brain was negative. Will order cervical spine x-ray to rule out degenerative cervical process, though very unlikely. Will try gabapentin $RemoveBefore'300mg'oQnfrqzycstyB$  BID to limit burning paresthesias on top of head. Will refer to ENT at [facility] for further diagnostic workup.   Jim Like, DO  [facility] [address] [place], [place] [address]  Phone [phone] Fax [phone]

## 2013-12-26 ENCOUNTER — Ambulatory Visit: Payer: Self-pay | Admitting: Neurology

## 2013-12-31 ENCOUNTER — Telehealth: Payer: Self-pay | Admitting: Neurology

## 2013-12-31 NOTE — Telephone Encounter (Signed)
Please put in an order for audiometry. [facility] recent hearing test. Last test 1 year ago. She has appointment for hearing test this Friday 8/14. After hearing evaluation she will be set up with a provider.

## 2013-12-31 NOTE — Telephone Encounter (Signed)
Can you call patient with results of cervical spine. Also the referral for ENT was submitted to the wrong physician. Dr. Erroll LunaBrowne only sees cancer patients. Who else would you like this patient to see? Also, with dx.rushing head sounds they need to know if she has had a hearing test.

## 2013-12-31 NOTE — Telephone Encounter (Signed)
[facility]. She has been evaluated by numerous ENT physicians in the past so I am sure she has had her hearing checked. Please confirm this with the patient though.   Please let her know her x-ray of the neck showed some arthritic changes but overall nothing to explain her symptoms.

## 2013-12-31 NOTE — Telephone Encounter (Signed)
Please fax order for hearing test Attn: Lawson FiscalLori @ [phone] Address given to patient daughter [address] [facility].

## 2014-01-01 NOTE — Telephone Encounter (Signed)
Order has been faxed

## 2014-01-11 DIAGNOSIS — H6120 Impacted cerumen, unspecified ear: Secondary | ICD-10-CM | POA: Diagnosis not present

## 2014-01-14 ENCOUNTER — Telehealth: Payer: Self-pay | Admitting: Neurology

## 2014-01-14 NOTE — Telephone Encounter (Signed)
Patient last saw Dr. Hosie Poisson in July 2015. She needs a referral to [facility] Rouchard-Plasser and [facility], ear nose and throat, for tinnitus (phone: [phone]) fax [phone].

## 2014-01-16 ENCOUNTER — Other Ambulatory Visit: Payer: Self-pay | Admitting: Neurology

## 2014-01-16 NOTE — Telephone Encounter (Signed)
Since a referral has already been placed I think you can just send it. Thanks.

## 2014-01-16 NOTE — Telephone Encounter (Signed)
Is it ok to send referral to Dr. Jaynie Collins, or do you need to put a new order in?

## 2014-01-17 NOTE — Telephone Encounter (Signed)
Sent!

## 2014-01-31 DIAGNOSIS — H6120 Impacted cerumen, unspecified ear: Secondary | ICD-10-CM | POA: Diagnosis not present

## 2014-02-04 DIAGNOSIS — H9190 Unspecified hearing loss, unspecified ear: Secondary | ICD-10-CM | POA: Diagnosis not present

## 2014-02-04 DIAGNOSIS — I10 Essential (primary) hypertension: Secondary | ICD-10-CM | POA: Diagnosis not present

## 2014-02-04 DIAGNOSIS — Z23 Encounter for immunization: Secondary | ICD-10-CM | POA: Diagnosis not present

## 2014-02-04 DIAGNOSIS — E559 Vitamin D deficiency, unspecified: Secondary | ICD-10-CM | POA: Diagnosis not present

## 2014-02-04 DIAGNOSIS — E119 Type 2 diabetes mellitus without complications: Secondary | ICD-10-CM | POA: Diagnosis not present

## 2014-02-04 DIAGNOSIS — G4700 Insomnia, unspecified: Secondary | ICD-10-CM | POA: Diagnosis not present

## 2014-02-04 DIAGNOSIS — R1032 Left lower quadrant pain: Secondary | ICD-10-CM | POA: Diagnosis not present

## 2014-02-04 DIAGNOSIS — M1710 Unilateral primary osteoarthritis, unspecified knee: Secondary | ICD-10-CM | POA: Diagnosis not present

## 2014-02-04 DIAGNOSIS — IMO0002 Reserved for concepts with insufficient information to code with codable children: Secondary | ICD-10-CM | POA: Diagnosis not present

## 2014-02-04 DIAGNOSIS — M109 Gout, unspecified: Secondary | ICD-10-CM | POA: Diagnosis not present

## 2014-03-13 DIAGNOSIS — H43813 Vitreous degeneration, bilateral: Secondary | ICD-10-CM | POA: Diagnosis not present

## 2014-03-13 DIAGNOSIS — H4011X3 Primary open-angle glaucoma, severe stage: Secondary | ICD-10-CM | POA: Diagnosis not present

## 2014-03-29 DIAGNOSIS — M7062 Trochanteric bursitis, left hip: Secondary | ICD-10-CM | POA: Diagnosis not present

## 2014-04-10 ENCOUNTER — Encounter (HOSPITAL_COMMUNITY): Payer: Self-pay

## 2014-04-10 ENCOUNTER — Inpatient Hospital Stay (HOSPITAL_COMMUNITY)
Admission: EM | Admit: 2014-04-10 | Discharge: 2014-04-11 | DRG: 195 | Disposition: A | Discharge status: Home / Self Care | Payer: Medicare Other | Attending: Internal Medicine | Admitting: Internal Medicine

## 2014-04-10 ENCOUNTER — Emergency Department (HOSPITAL_COMMUNITY): Payer: Medicare Other

## 2014-04-10 DIAGNOSIS — J189 Pneumonia, unspecified organism: Secondary | ICD-10-CM | POA: Diagnosis present

## 2014-04-10 DIAGNOSIS — R1013 Epigastric pain: Secondary | ICD-10-CM | POA: Diagnosis not present

## 2014-04-10 DIAGNOSIS — M545 Low back pain: Secondary | ICD-10-CM | POA: Diagnosis present

## 2014-04-10 DIAGNOSIS — Z9049 Acquired absence of other specified parts of digestive tract: Secondary | ICD-10-CM | POA: Diagnosis present

## 2014-04-10 DIAGNOSIS — G8929 Other chronic pain: Secondary | ICD-10-CM | POA: Diagnosis present

## 2014-04-10 DIAGNOSIS — Z91040 Latex allergy status: Secondary | ICD-10-CM | POA: Diagnosis not present

## 2014-04-10 DIAGNOSIS — E118 Type 2 diabetes mellitus with unspecified complications: Secondary | ICD-10-CM | POA: Diagnosis not present

## 2014-04-10 DIAGNOSIS — R079 Chest pain, unspecified: Secondary | ICD-10-CM

## 2014-04-10 DIAGNOSIS — Z881 Allergy status to other antibiotic agents status: Secondary | ICD-10-CM | POA: Diagnosis not present

## 2014-04-10 DIAGNOSIS — M109 Gout, unspecified: Secondary | ICD-10-CM | POA: Diagnosis present

## 2014-04-10 DIAGNOSIS — H8092 Unspecified otosclerosis, left ear: Secondary | ICD-10-CM | POA: Diagnosis present

## 2014-04-10 DIAGNOSIS — F329 Major depressive disorder, single episode, unspecified: Secondary | ICD-10-CM | POA: Diagnosis present

## 2014-04-10 DIAGNOSIS — Z882 Allergy status to sulfonamides status: Secondary | ICD-10-CM

## 2014-04-10 DIAGNOSIS — F419 Anxiety disorder, unspecified: Secondary | ICD-10-CM | POA: Diagnosis present

## 2014-04-10 DIAGNOSIS — Z885 Allergy status to narcotic agent status: Secondary | ICD-10-CM | POA: Diagnosis not present

## 2014-04-10 DIAGNOSIS — Z96651 Presence of right artificial knee joint: Secondary | ICD-10-CM | POA: Diagnosis present

## 2014-04-10 DIAGNOSIS — M179 Osteoarthritis of knee, unspecified: Secondary | ICD-10-CM | POA: Diagnosis present

## 2014-04-10 DIAGNOSIS — E119 Type 2 diabetes mellitus without complications: Secondary | ICD-10-CM | POA: Diagnosis present

## 2014-04-10 DIAGNOSIS — G4700 Insomnia, unspecified: Secondary | ICD-10-CM | POA: Diagnosis present

## 2014-04-10 DIAGNOSIS — Z90710 Acquired absence of both cervix and uterus: Secondary | ICD-10-CM

## 2014-04-10 DIAGNOSIS — R918 Other nonspecific abnormal finding of lung field: Secondary | ICD-10-CM | POA: Diagnosis not present

## 2014-04-10 DIAGNOSIS — E08329 Diabetes mellitus due to underlying condition with mild nonproliferative diabetic retinopathy without macular edema: Secondary | ICD-10-CM

## 2014-04-10 DIAGNOSIS — M5432 Sciatica, left side: Secondary | ICD-10-CM | POA: Diagnosis present

## 2014-04-10 DIAGNOSIS — G629 Polyneuropathy, unspecified: Secondary | ICD-10-CM | POA: Diagnosis present

## 2014-04-10 DIAGNOSIS — K30 Functional dyspepsia: Secondary | ICD-10-CM | POA: Diagnosis present

## 2014-04-10 DIAGNOSIS — R072 Precordial pain: Secondary | ICD-10-CM | POA: Diagnosis not present

## 2014-04-10 DIAGNOSIS — K219 Gastro-esophageal reflux disease without esophagitis: Secondary | ICD-10-CM | POA: Diagnosis present

## 2014-04-10 DIAGNOSIS — R0789 Other chest pain: Secondary | ICD-10-CM

## 2014-04-10 DIAGNOSIS — Z79899 Other long term (current) drug therapy: Secondary | ICD-10-CM

## 2014-04-10 DIAGNOSIS — I10 Essential (primary) hypertension: Secondary | ICD-10-CM

## 2014-04-10 DIAGNOSIS — R071 Chest pain on breathing: Secondary | ICD-10-CM | POA: Diagnosis not present

## 2014-04-10 LAB — CBC
HEMATOCRIT: 38.4 % (ref 36.0–46.0)
Hemoglobin: 12.4 g/dL (ref 12.0–15.0)
MCH: 27.6 pg (ref 26.0–34.0)
MCHC: 32.3 g/dL (ref 30.0–36.0)
MCV: 85.3 fL (ref 78.0–100.0)
Platelets: 244 10*3/uL (ref 150–400)
RBC: 4.5 MIL/uL (ref 3.87–5.11)
RDW: 15.2 % (ref 11.5–15.5)
WBC: 11.8 10*3/uL — ABNORMAL HIGH (ref 4.0–10.5)

## 2014-04-10 LAB — BASIC METABOLIC PANEL
ANION GAP: 15 (ref 5–15)
BUN: 35 mg/dL — ABNORMAL HIGH (ref 6–23)
CALCIUM: 10.4 mg/dL (ref 8.4–10.5)
CHLORIDE: 106 meq/L (ref 96–112)
CO2: 17 mEq/L — ABNORMAL LOW (ref 19–32)
Creatinine, Ser: 1.19 mg/dL — ABNORMAL HIGH (ref 0.50–1.10)
GFR calc non Af Amer: 40 mL/min — ABNORMAL LOW (ref >90)
GFR, EST AFRICAN AMERICAN: 46 mL/min — AB (ref >90)
Glucose, Bld: 132 mg/dL — ABNORMAL HIGH (ref 70–99)
POTASSIUM: 5.3 meq/L (ref 3.7–5.3)
Sodium: 138 mEq/L (ref 137–147)

## 2014-04-10 LAB — I-STAT TROPONIN, ED: TROPONIN I, POC: 0 ng/mL (ref 0.00–0.08)

## 2014-04-10 MED ORDER — DEXTROSE 5 % IV SOLN
500.0000 mg | Freq: Once | INTRAVENOUS | Status: AC
  Administered 2014-04-11: 500 mg via INTRAVENOUS
  Filled 2014-04-10: qty 500

## 2014-04-10 MED ORDER — DEXTROSE 5 % IV SOLN
1.0000 g | Freq: Once | INTRAVENOUS | Status: AC
  Administered 2014-04-10: 1 g via INTRAVENOUS
  Filled 2014-04-10: qty 10

## 2014-04-10 NOTE — ED Notes (Signed)
Per EMS, patient reports centralized chest pain starting after lunch around 0130. Denies radiation. Denies N/V, SOB. Given 324 ASA.

## 2014-04-10 NOTE — ED Notes (Signed)
Called report to floor, spoke to receiving RN, Dewayne HatchAnn. Concern for appropriate floor placement, patient reports chest pain 4/10 achy. Charge nurse of floor would like confirmation from admitting physician prior to arrival. Dr. Allena KatzPatel paged.

## 2014-04-10 NOTE — ED Provider Notes (Signed)
CSN: [id number]     Arrival date & time 04/10/14  2048 History   First MD Initiated Contact with Patient 04/10/14 2108     Chief Complaint  Patient presents with  . Chest Pain      HPI Patient presents with right parasternal pain since approximately 1:30 this afternoon.  Denies nausea vomiting shortness of breath.  Pain is worse when she moves or presses on the area. Past Medical History  Diagnosis Date  . Tinnitus of both ears     Chronic severe, and mixed conductive and sensorineural hearing loss  . Hypertension   . Diverticulosis     with episodic diverticulitis  . Low back pain     Chronic  . Hypertension   . Insomnia     severe  . Gout   . Anxiety   . Depression   . HA (headache)     chronic  . Dyspepsia     persistent  . Degenerative disc disease     knees  . Allergic rhinitis   . Diabetes mellitus     type 2  . Otosclerosis     with left stapedectomy  . Sciatica     left lower extremity    Past Surgical History  Procedure Laterality Date  . Stapedectomy Left     middle ear  . Total knee arthroplasty Right   . Cholecystectomy    . Total abdominal hysterectomy    . Appendectomy    . Breast reduction surgery Bilateral    Family History  Problem Relation Age of Onset  . CAD Father   . CVA Mother   . Hypertension Brother   . Prostate cancer Brother   . Cancer Sister     BRAIN TUMOR   History  Substance Use Topics  . Smoking status: Never Smoker   . Smokeless tobacco: Never Used  . Alcohol Use: No   OB History    No data available     Review of Systems  All other systems reviewed and are negative  Allergies  Latex; Codeine; Colchicine; Hctz; Meperidine; Sonata; and Sulfa antibiotics  Home Medications   Prior to Admission medications   Medication Sig Start Date End Date Taking? Authorizing Provider  ALPRAZolam Prudy Feeler(XANAX) 0.5 MG tablet Take 0.5 mg by mouth at bedtime as needed for anxiety.   Yes Historical Provider, MD  gabapentin  (NEURONTIN) 300 MG capsule Take 1 capsule (300 mg total) by mouth 2 (two) times daily. 12/21/13  Yes Omelia BlackwaterPeter Justin Sumner, DO  latanoprost (XALATAN) 0.005 % ophthalmic solution Place 1 drop into the right eye at bedtime. Frequency:QD   Dosage:0.0     Instructions:  Note:Dose: 0.005 % 03/27/12  Yes Historical Provider, MD  lisinopril (PRINIVIL,ZESTRIL) 10 MG tablet Take 10 mg by mouth daily.   Yes Historical Provider, MD  METFORMIN HCL PO Take 500 mg by mouth 2 (two) times daily.    Yes Historical Provider, MD  traMADol-acetaminophen (ULTRACET) 37.5-325 MG per tablet Take 1 tablet by mouth every 6 (six) hours as needed for moderate pain.    Yes Historical Provider, MD  zolpidem (AMBIEN) 10 MG tablet Take 10 mg by mouth at bedtime as needed for sleep.   Yes Historical Provider, MD  glucose blood test strip 1 each by Other route as needed for other. Use as instructed    Historical Provider, MD   BP 115/56 mmHg  Pulse 75  Temp(Src) 99.1 F (37.3 C)  Resp 18  SpO2 99%  Physical Exam  Nursing note and vitals reviewed. Constitutional: She is oriented to person, place, and time. She appears well-developed and well-nourished. No distress.  HENT:  Head: Normocephalic and atraumatic.  Eyes: Pupils are equal, round, and reactive to light.  Neck: Normal range of motion.      Physical Exam  Pulmonary/Chest:    Pain reproducible to palpation     Pulmonary/Chest: No respiratory distress.  Abdominal: Normal appearance. She exhibits no distension.  Musculoskeletal: Normal range of motion.  Neurological: She is alert and oriented to person, place, and time. No cranial nerve deficit.  Skin: Skin is warm and dry. No rash noted.  Psychiatric: She has a normal mood and affect. Her behavior is normal.   ED Course  Procedures (including critical care time)  Medications  cefTRIAXone (ROCEPHIN) 1 g in dextrose 5 % 50 mL IVPB (not administered)  azithromycin (ZITHROMAX) 500 mg in dextrose 5 % 250 mL  IVPB (not administered)    Labs Review Labs Reviewed  CBC - Abnormal; Notable for the following:    WBC 11.8 (*)    All other components within normal limits  BASIC METABOLIC PANEL - Abnormal; Notable for the following:    CO2 17 (*)    Glucose, Bld 132 (*)    BUN 35 (*)    Creatinine, Ser 1.19 (*)    GFR calc non Af Amer 40 (*)    GFR calc Af Amer 46 (*)    All other components within normal limits  I-STAT TROPOININ, ED    Imaging Review Dg Chest 2 View  04/10/2014   CLINICAL DATA:  Chest pain RIGHT of sternum, tightness today, history hypertension, diabetes  EXAM: CHEST  2 VIEW  COMPARISON:  12/04/2004  FINDINGS: Upper normal heart size.  Calcified tortuous aorta.  Mediastinal contours and pulmonary vascularity normal.  LEFT basilar infiltrate consistent with pneumonia likely in lower lobe, poorly localized on lateral view.  Remaining lungs clear.  No pleural effusion or pneumothorax.  Bones demineralized.  IMPRESSION: LEFT basilar infiltrate likely representing pneumonia, probably in LEFT lower lobe   Electronically Signed   By: Ulyses SouthwardMark  Boles M.D.   On: 04/10/2014 22:38     EKG Interpretation   Date/Time:  Wednesday April 10 2014 20:56:56 EST Ventricular Rate:  85 PR Interval:  203 QRS Duration: 78 QT Interval:  344 QTC Calculation: 409 R Axis:   -45 Text Interpretation:  Sinus rhythm LVH by voltage Inferior infarct, old  Anterior Q waves, possibly due to LVH Baseline wander in lead(s) I III aVL  No significant change since last tracing Confirmed by Jahquez Steffler  MD, Tressie Ragin  ([id number]) on 04/10/2014 9:34:13 PM      MDM   Final diagnoses:  Chest pain  Community acquired pneumonia        Nelia Shiobert L Kinsley Nicklaus, MD 04/10/14 [phone]

## 2014-04-10 NOTE — ED Notes (Signed)
Patient transported to X-ray 

## 2014-04-10 NOTE — ED Notes (Signed)
Spoke to Dr. Allena KatzPatel face to face in regards to patient placement.

## 2014-04-11 ENCOUNTER — Encounter (HOSPITAL_COMMUNITY): Payer: Self-pay

## 2014-04-11 DIAGNOSIS — E119 Type 2 diabetes mellitus without complications: Secondary | ICD-10-CM

## 2014-04-11 DIAGNOSIS — R1013 Epigastric pain: Secondary | ICD-10-CM | POA: Diagnosis present

## 2014-04-11 DIAGNOSIS — R079 Chest pain, unspecified: Secondary | ICD-10-CM | POA: Diagnosis present

## 2014-04-11 DIAGNOSIS — I10 Essential (primary) hypertension: Secondary | ICD-10-CM | POA: Diagnosis present

## 2014-04-11 LAB — COMPREHENSIVE METABOLIC PANEL
ALT: 24 U/L (ref 0–35)
AST: 18 U/L (ref 0–37)
Albumin: 3.7 g/dL (ref 3.5–5.2)
Alkaline Phosphatase: 58 U/L (ref 39–117)
Anion gap: 15 (ref 5–15)
BUN: 32 mg/dL — ABNORMAL HIGH (ref 6–23)
CALCIUM: 9.9 mg/dL (ref 8.4–10.5)
CO2: 17 meq/L — AB (ref 19–32)
Chloride: 109 mEq/L (ref 96–112)
Creatinine, Ser: 1.1 mg/dL (ref 0.50–1.10)
GFR calc Af Amer: 50 mL/min — ABNORMAL LOW (ref >90)
GFR, EST NON AFRICAN AMERICAN: 44 mL/min — AB (ref >90)
GLUCOSE: 124 mg/dL — AB (ref 70–99)
POTASSIUM: 5 meq/L (ref 3.7–5.3)
SODIUM: 141 meq/L (ref 137–147)
Total Bilirubin: 0.2 mg/dL — ABNORMAL LOW (ref 0.3–1.2)
Total Protein: 7.1 g/dL (ref 6.0–8.3)

## 2014-04-11 LAB — CBC WITH DIFFERENTIAL/PLATELET
Basophils Absolute: 0.1 10*3/uL (ref 0.0–0.1)
Basophils Relative: 1 % (ref 0–1)
Eosinophils Absolute: 0.1 10*3/uL (ref 0.0–0.7)
Eosinophils Relative: 2 % (ref 0–5)
HCT: 36.3 % (ref 36.0–46.0)
Hemoglobin: 11.5 g/dL — ABNORMAL LOW (ref 12.0–15.0)
LYMPHS ABS: 2.6 10*3/uL (ref 0.7–4.0)
LYMPHS PCT: 29 % (ref 12–46)
MCH: 26.4 pg (ref 26.0–34.0)
MCHC: 31.7 g/dL (ref 30.0–36.0)
MCV: 83.4 fL (ref 78.0–100.0)
Monocytes Absolute: 0.4 10*3/uL (ref 0.1–1.0)
Monocytes Relative: 4 % (ref 3–12)
NEUTROS ABS: 6 10*3/uL (ref 1.7–7.7)
NEUTROS PCT: 64 % (ref 43–77)
PLATELETS: 227 10*3/uL (ref 150–400)
RBC: 4.35 MIL/uL (ref 3.87–5.11)
RDW: 15.1 % (ref 11.5–15.5)
WBC: 9.2 10*3/uL (ref 4.0–10.5)

## 2014-04-11 LAB — TROPONIN I

## 2014-04-11 LAB — GLUCOSE, CAPILLARY
GLUCOSE-CAPILLARY: 178 mg/dL — AB (ref 70–99)
GLUCOSE-CAPILLARY: 122 mg/dL — AB (ref 70–99)
Glucose-Capillary: 176 mg/dL — ABNORMAL HIGH (ref 70–99)

## 2014-04-11 LAB — STREP PNEUMONIAE URINARY ANTIGEN: STREP PNEUMO URINARY ANTIGEN: NEGATIVE

## 2014-04-11 LAB — HEMOGLOBIN A1C
Hgb A1c MFr Bld: 6.3 % — ABNORMAL HIGH (ref <5.7)
Mean Plasma Glucose: 134 mg/dL — ABNORMAL HIGH (ref <117)

## 2014-04-11 MED ORDER — DEXTROSE 5 % IV SOLN
500.0000 mg | INTRAVENOUS | Status: DC
  Administered 2014-04-11: 500 mg via INTRAVENOUS
  Filled 2014-04-11 (×2): qty 500

## 2014-04-11 MED ORDER — ONDANSETRON HCL 4 MG/2ML IJ SOLN
4.0000 mg | Freq: Four times a day (QID) | INTRAMUSCULAR | Status: DC | PRN
  Filled 2014-04-11: qty 2

## 2014-04-11 MED ORDER — HEPARIN SODIUM (PORCINE) 5000 UNIT/ML IJ SOLN
5000.0000 [IU] | Freq: Three times a day (TID) | INTRAMUSCULAR | Status: DC
Start: 2014-04-11 — End: 2014-04-11
  Administered 2014-04-11: 5000 [IU] via SUBCUTANEOUS
  Filled 2014-04-11 (×4): qty 1

## 2014-04-11 MED ORDER — ALPRAZOLAM 0.5 MG PO TABS: 0.5000 mg | ORAL_TABLET | Freq: Every evening | ORAL | Status: DC | PRN

## 2014-04-11 MED ORDER — LEVOFLOXACIN 500 MG PO TABS: 500.0000 mg | ORAL_TABLET | Freq: Every day | ORAL | Status: DC

## 2014-04-11 MED ORDER — ONDANSETRON HCL 4 MG/2ML IJ SOLN: 4.0000 mg | Freq: Three times a day (TID) | INTRAMUSCULAR | Status: DC | PRN

## 2014-04-11 MED ORDER — ACETAMINOPHEN 325 MG PO TABS: 650.0000 mg | ORAL_TABLET | ORAL | Status: DC | PRN

## 2014-04-11 MED ORDER — INSULIN ASPART 100 UNIT/ML ~~LOC~~ SOLN: 0.0000 [IU] | Freq: Every day | SUBCUTANEOUS | Status: DC

## 2014-04-11 MED ORDER — LISINOPRIL 10 MG PO TABS
10.0000 mg | ORAL_TABLET | Freq: Every day | ORAL | Status: DC
  Administered 2014-04-11: 10 mg via ORAL
  Filled 2014-04-11: qty 1

## 2014-04-11 MED ORDER — GI COCKTAIL ~~LOC~~
30.0000 mL | Freq: Four times a day (QID) | ORAL | Status: DC | PRN
  Filled 2014-04-11: qty 30

## 2014-04-11 MED ORDER — ZOLPIDEM TARTRATE 5 MG PO TABS: 10.0000 mg | ORAL_TABLET | Freq: Every evening | ORAL | Status: DC | PRN

## 2014-04-11 MED ORDER — CEFTRIAXONE SODIUM IN DEXTROSE 20 MG/ML IV SOLN
1.0000 g | INTRAVENOUS | Status: DC
  Administered 2014-04-11: 1 g via INTRAVENOUS
  Filled 2014-04-11 (×2): qty 50

## 2014-04-11 MED ORDER — INSULIN ASPART 100 UNIT/ML ~~LOC~~ SOLN
0.0000 [IU] | Freq: Three times a day (TID) | SUBCUTANEOUS | Status: DC
  Administered 2014-04-11: 3 [IU] via SUBCUTANEOUS

## 2014-04-11 MED ORDER — MORPHINE SULFATE 2 MG/ML IJ SOLN
2.0000 mg | Freq: Once | INTRAMUSCULAR | Status: DC
  Filled 2014-04-11: qty 1

## 2014-04-11 MED ORDER — ASPIRIN EC 81 MG PO TBEC
81.0000 mg | DELAYED_RELEASE_TABLET | Freq: Every day | ORAL | Status: DC
  Administered 2014-04-11: 81 mg via ORAL
  Filled 2014-04-11: qty 1

## 2014-04-11 MED ORDER — GABAPENTIN 300 MG PO CAPS
300.0000 mg | ORAL_CAPSULE | Freq: Two times a day (BID) | ORAL | Status: DC
  Filled 2014-04-11 (×3): qty 1

## 2014-04-11 MED ORDER — LATANOPROST 0.005 % OP SOLN
1.0000 [drp] | Freq: Every day | OPHTHALMIC | Status: DC
  Administered 2014-04-11: 1 [drp] via OPHTHALMIC
  Filled 2014-04-11: qty 2.5

## 2014-04-11 MED ORDER — TRAMADOL-ACETAMINOPHEN 37.5-325 MG PO TABS: 1.0000 | ORAL_TABLET | Freq: Four times a day (QID) | ORAL | Status: DC | PRN

## 2014-04-11 MED ORDER — IBUPROFEN 200 MG PO TABS: 400.0000 mg | ORAL_TABLET | Freq: Four times a day (QID) | ORAL | Status: AC | PRN

## 2014-04-11 MED ORDER — ONDANSETRON HCL 4 MG/2ML IJ SOLN: 4.0000 mg | Freq: Four times a day (QID) | INTRAMUSCULAR | Status: DC | PRN

## 2014-04-11 MED ORDER — ZOLPIDEM TARTRATE 5 MG PO TABS
5.0000 mg | ORAL_TABLET | Freq: Every evening | ORAL | Status: DC | PRN
  Administered 2014-04-11: 5 mg via ORAL
  Filled 2014-04-11: qty 1

## 2014-04-11 NOTE — ED Notes (Signed)
Discussed with receiving RN that patient is appropriate for tele floor per Dr. Allena KatzPatel, pain 1/10 without use of morphine.

## 2014-04-11 NOTE — Progress Notes (Signed)
Utilization Review Completed.Terique Kawabata T11/19/2015  

## 2014-04-11 NOTE — Progress Notes (Signed)
Pt/family given discharge instructions, medication lists, follow up appointments, and when to call the doctor.  Pt/family verbalizes understanding. Pt advised about starting antibiotics if fever or cough. Thomas HoffBurton, Valor Turberville McClintock, RN

## 2014-04-11 NOTE — ED Notes (Signed)
Patient refused morphine. Now reporting pain 1/10. Reported to dr. Allena KatzPatel. Md allows patient to travel to tele floor.

## 2014-04-11 NOTE — H&P (Signed)
[facility] History and Physical  Patient: Haley Campbell  NWG:[id number]  DOB: 07-01-1923  DOS: the patient was seen and examined on 04/10/2014 PCP: Pearla Dubonnet, MD  Chief Complaint: Chest pain  HPI: Haley Campbell is a 80 y.o. female with Past medical history of hypertension, tinnitus, anxiety, diabetes mellitus and neuropathy. The patient is presenting with complaints of chest pain that was affecting her right side. She mentions she was at her baseline and when she woke up she started having complaints of pain on the right side which was located on the right side of the chest worsening with breathing worsening with moving as well as reproducible. Although she mentions the pain sometimes felt like pressure  Most of the time it felt to her as a dull ache he did She denies any similar pain in the past she denies any prior cardiac workup or cardiac history. She denies any shortness of breath nausea vomiting abdominal pain diarrhea constipation burning urination or leg swelling orthopnea PND or any focal deficit at the time of my evaluation. She mentions she is compliant with all her medications. She denies any cough or fever or chills.  The patient is coming from home And at her baseline independent for most of her ADL.  Review of Systems: as mentioned in the history of present illness.  A Comprehensive review of the other systems is negative.  Past Medical History  Diagnosis Date  . Tinnitus of both ears     Chronic severe, and mixed conductive and sensorineural hearing loss  . Hypertension   . Diverticulosis     with episodic diverticulitis  . Low back pain     Chronic  . Hypertension   . Insomnia     severe  . Gout   . Anxiety   . Depression   . HA (headache)     chronic  . Dyspepsia     persistent  . Degenerative disc disease     knees  . Allergic rhinitis   . Diabetes mellitus     type 2  . Otosclerosis     with left stapedectomy  . Sciatica     left  lower extremity    Past Surgical History  Procedure Laterality Date  . Stapedectomy Left     middle ear  . Total knee arthroplasty Right   . Cholecystectomy    . Total abdominal hysterectomy    . Appendectomy    . Breast reduction surgery Bilateral    Social History:  reports that she has never smoked. She has never used smokeless tobacco. She reports that she does not drink alcohol or use illicit drugs.  Allergies  Allergen Reactions  . Latex Other (See Comments)    Severe mucosal allergy  . Codeine Nausea Only  . Colchicine Diarrhea and Nausea And Vomiting  . Hctz [Hydrochlorothiazide] Nausea Only and Other (See Comments)    Fever and Headache  . Meperidine     Other reaction(s): RASH  . Sonata [Zaleplon] Nausea Only and Other (See Comments)    No sleep  . Sulfa Antibiotics Rash    Family History  Problem Relation Age of Onset  . CAD Father   . CVA Mother   . Hypertension Brother   . Prostate cancer Brother   . Cancer Sister     BRAIN TUMOR    Prior to Admission medications   Medication Sig Start Date End Date Taking? Authorizing Provider  ALPRAZolam Prudy Feeler) 0.5 MG tablet Take 0.5  mg by mouth at bedtime as needed for anxiety.   Yes Historical Provider, MD  gabapentin (NEURONTIN) 300 MG capsule Take 1 capsule (300 mg total) by mouth 2 (two) times daily. 12/21/13  Yes Omelia Blackwater, DO  latanoprost (XALATAN) 0.005 % ophthalmic solution Place 1 drop into the right eye at bedtime. Frequency:QD   Dosage:0.0     Instructions:  Note:Dose: 0.005 % 03/27/12  Yes Historical Provider, MD  lisinopril (PRINIVIL,ZESTRIL) 10 MG tablet Take 10 mg by mouth daily.   Yes Historical Provider, MD  METFORMIN HCL PO Take 500 mg by mouth 2 (two) times daily.    Yes Historical Provider, MD  traMADol-acetaminophen (ULTRACET) 37.5-325 MG per tablet Take 1 tablet by mouth every 6 (six) hours as needed for moderate pain.    Yes Historical Provider, MD  zolpidem (AMBIEN) 10 MG tablet Take 10  mg by mouth at bedtime as needed for sleep.   Yes Historical Provider, MD  glucose blood test strip 1 each by Other route as needed for other. Use as instructed    Historical Provider, MD    Physical Exam: Filed Vitals:   04/10/14 2230 04/10/14 2237 04/10/14 2300 04/10/14 2330  BP: 138/58 138/58 115/56 131/58  Pulse: 81 82 75 81  Temp:      Resp: 28 16 18 19   SpO2: 99% 98% 99% 97%    General: Alert, Awake and Oriented to Time, Place and Person. Appear in mild distress Eyes: PERRL ENT: Oral Mucosa clear moist. Neck: No JVD Cardiovascular: S1 and S2 Present, aortic systolic Murmur, Peripheral Pulses Present Respiratory: Bilateral Air entry equal and Decreased, Clear to Auscultation, no Crackles, no wheezes Abdomen: Bowel Sound present, Soft and non- tender Skin: No Rash Extremities: Trace Pedal edema, no calf tenderness Neurologic: Grossly no focal neuro deficit.  Labs on Admission:  CBC:  Recent Labs Lab 04/10/14 2109  WBC 11.8*  HGB 12.4  HCT 38.4  MCV 85.3  PLT 244    CMP     Component Value Date/Time   NA 138 04/10/2014 2109   K 5.3 04/10/2014 2109   CL 106 04/10/2014 2109   CO2 17* 04/10/2014 2109   GLUCOSE 132* 04/10/2014 2109   BUN 35* 04/10/2014 2109   CREATININE 1.19* 04/10/2014 2109   CALCIUM 10.4 04/10/2014 2109   GFRNONAA 40* 04/10/2014 2109   GFRAA 46* 04/10/2014 2109    No results for input(s): LIPASE, AMYLASE in the last 168 hours. No results for input(s): AMMONIA in the last 168 hours.  No results for input(s): CKTOTAL, CKMB, CKMBINDEX, TROPONINI in the last 168 hours. BNP (last 3 results) No results for input(s): PROBNP in the last 8760 hours.  Radiological Exams on Admission: Dg Chest 2 View  04/10/2014   CLINICAL DATA:  Chest pain RIGHT of sternum, tightness today, history hypertension, diabetes  EXAM: CHEST  2 VIEW  COMPARISON:  12/04/2004  FINDINGS: Upper normal heart size.  Calcified tortuous aorta.  Mediastinal contours and pulmonary  vascularity normal.  LEFT basilar infiltrate consistent with pneumonia likely in lower lobe, poorly localized on lateral view.  Remaining lungs clear.  No pleural effusion or pneumothorax.  Bones demineralized.  IMPRESSION: LEFT basilar infiltrate likely representing pneumonia, probably in LEFT lower lobe   Electronically Signed   By: Ulyses Southward M.D.   On: 04/10/2014 22:38   EKG: Independently reviewed. normal sinus rhythm, nonspecific ST and T waves changes.  Assessment/Plan Principal Problem:   CAP (community acquired pneumonia) Active Problems:  Hypertension   Dyspepsia   Diabetes mellitus   Chest pain   1. CAP (community acquired pneumonia) The patient is presenting with complaints of right-sided chest pain. On workup she is found to have a left-sided small infiltrate. On reviewing her prior chest x-ray she had a small infiltrate in 1 on the prior chest x-ray over year ago. With complaints of chest pain and mild elevation of white count she was started on ceftriaxone and azithromycin in the ER for suspected community-acquired pneumonia. At present she will be admitted in the hospital and I will obtain sputum culture urine antigens as well as blood culture for further workup. If her cultures come back negative then she may be discontinued with the antibiotics.  2. Chest pain. Patient complaint of right-sided chest pain. EKG and troponins are negative chest pain has resolved. At present I will admit her to the telemetry and follow serial troponin and opted EKG in the morning she will remain nothing by mouth after midnight.  3. Diabetes mellitus. Holding metformin and placing her on sliding scale.  4.GERD. Patient did mention about history of GERD. Present I would use GI cocktail as needed.  5. Essential hypertension. Stable. Continue home medication.  Advance goals of care discussion: Full code   DVT Prophylaxis: subcutaneous Heparin Nutrition: Nothing by mouth after  midnight  Family Communication: Daughter was present at bedside, opportunity was given to ask question and all questions were answered satisfactorily at the time of interview. Disposition: Admitted to inpatient in telemetry unit.  Author: Lynden Oxford, MD [facility] Pager: [phone]    If 7PM-7AM, please contact night-coverage [email or web address] Password [email or web address]

## 2014-04-11 NOTE — ED Notes (Signed)
Phlebotomy at the bedside to draw troponin

## 2014-04-11 NOTE — ED Notes (Signed)
Dr. Allena KatzPatel gives update on plan of care. Patient is allowed to eat now, and will be NPO afterwards.  He plans to attempt pain control with medications, and then transfer to telebed. No new verbal orders for medications at this time.

## 2014-04-11 NOTE — Progress Notes (Signed)
  Echocardiogram 2D Echocardiogram has been performed.  Haley Campbell, Haley Campbell 04/11/2014, 2:39 PM

## 2014-04-11 NOTE — Plan of Care (Signed)
Problem: Phase I Progression Outcomes Goal: Pain controlled with appropriate interventions Outcome: Completed/Met Date Met:  04/11/14 Goal: OOB as tolerated unless otherwise ordered Outcome: Completed/Met Date Met:  04/11/14 Goal: Hemodynamically stable Outcome: Completed/Met Date Met:  04/11/14     

## 2014-04-12 LAB — LEGIONELLA ANTIGEN, URINE

## 2014-04-16 NOTE — Discharge Summary (Signed)
Physician Discharge Summary  Haley Campbell RSW:[id number] DOB: 12-04-1923 DOA: 04/10/2014  PCP: Pearla Dubonnet, MD  Admit date: 04/10/2014 Discharge date: 04/11/14  Recommendations for Outpatient Follow-up:  1. If cough, fever, chills develop, fill rx for levaquin  Discharge Diagnoses:  Principal Problem:   Abnormal CXR, doubt pneumonia Active Problems:   Hypertension   Diabetes mellitus   Right sided Chest pain, likely musculoskeletal   Discharge Condition: stable  Filed Weights   04/11/14 0122  Weight: 65.7 kg (144 lb 13.5 oz)    History of present illness:  80 y.o. female with Past medical history of hypertension, tinnitus, anxiety, diabetes mellitus and neuropathy. The patient is presenting with complaints of chest pain that was affecting her right side. She mentions she was at her baseline and when she woke up she started having complaints of pain on the right side which was located on the right side of the chest worsening with breathing worsening with moving as well as reproducible. Although she mentions the pain sometimes felt like pressure Most of the time it felt to her as a dull ache he did She denies any similar pain in the past she denies any prior cardiac workup or cardiac history. She denies any shortness of breath nausea vomiting abdominal pain diarrhea constipation burning urination or leg swelling orthopnea PND or any focal deficit at the time of my evaluation. She mentions she is compliant with all her medications. She denies any cough or fever or chills.  CXR showed possible left sided infiltrate  Hospital Course:  Observed overnight. Started on antibiotics for abnormal CXR. As patient has no cough, f/c, and pain is on the right side and is reproducible, antibiotics will be stopped unless she develops symptoms of pneumonia. MI ruled out. May take nsaids for pain. Feeling much better and requesting  discharge.  Procedures:  none  Consultations:  none  Discharge Exam: Filed Vitals:   04/11/14 1444  BP: 154/80  Pulse: 85  Temp: 98.6 F (37 C)  Resp: 18    General: comfortable Cardiovascular: RRR  Chest wall: right chest with tenderness to palpation Respiratory: CTA without WRR  Discharge Instructions    Diet Carb Modified    Complete by:  As directed      Discharge instructions    Complete by:  As directed   If you develop cough or fever, fill levofloxacin prescription. If no cough or fever, do not fill prescription     Increase activity slowly    Complete by:  As directed           Discharge Medication List as of 04/11/2014  3:27 PM    START taking these medications   Details  ibuprofen (ADVIL,MOTRIN) 200 MG tablet Take 2 tablets (400 mg total) by mouth every 6 (six) hours as needed for fever or mild pain., Starting 04/11/2014, Until Discontinued, OTC    levofloxacin (LEVAQUIN) 500 MG tablet Take 1 tablet (500 mg total) by mouth daily. If you develop cough and fever, Starting 04/11/2014, Until Discontinued, Print      CONTINUE these medications which have NOT CHANGED   Details  ALPRAZolam (XANAX) 0.5 MG tablet Take 0.5 mg by mouth at bedtime as needed for anxiety., Until Discontinued, Historical Med    gabapentin (NEURONTIN) 300 MG capsule Take 1 capsule (300 mg total) by mouth 2 (two) times daily., Starting 12/21/2013, Until Discontinued, Normal    latanoprost (XALATAN) 0.005 % ophthalmic solution Place 1 drop into the right eye at bedtime.  Frequency:QD   Dosage:0.0     Instructions:  Note:Dose: 0.005 %, Starting 03/27/2012, Until Discontinued, Historical Med    lisinopril (PRINIVIL,ZESTRIL) 10 MG tablet Take 10 mg by mouth daily., Until Discontinued, Historical Med    METFORMIN HCL PO Take 500 mg by mouth 2 (two) times daily. , Until Discontinued, Historical Med    traMADol-acetaminophen (ULTRACET) 37.5-325 MG per tablet Take 1 tablet by mouth every 6  (six) hours as needed for moderate pain. , Until Discontinued, Historical Med    zolpidem (AMBIEN) 10 MG tablet Take 10 mg by mouth at bedtime as needed for sleep., Until Discontinued, Historical Med    glucose blood test strip 1 each by Other route as needed for other. Use as instructed, Historical Med       Allergies  Allergen Reactions  . Latex Other (See Comments)    Severe mucosal allergy  . Codeine Nausea Only  . Colchicine Diarrhea and Nausea And Vomiting  . Hctz [Hydrochlorothiazide] Nausea Only and Other (See Comments)    Fever and Headache  . Meperidine     Other reaction(s): RASH  . Sonata [Zaleplon] Nausea Only and Other (See Comments)    No sleep  . Sulfa Antibiotics Rash   Follow-up Information    Follow up with GATES,ROBERT NEVILL, MD.   Specialty:  Internal Medicine   Why:  If symptoms worsen   Contact information:   [address] [place] [address] [phone]        The results of significant diagnostics from this hospitalization (including imaging, microbiology, ancillary and laboratory) are listed below for reference.    Significant Diagnostic Studies: Dg Chest 2 View  04/10/2014   CLINICAL DATA:  Chest pain RIGHT of sternum, tightness today, history hypertension, diabetes  EXAM: CHEST  2 VIEW  COMPARISON:  12/04/2004  FINDINGS: Upper normal heart size.  Calcified tortuous aorta.  Mediastinal contours and pulmonary vascularity normal.  LEFT basilar infiltrate consistent with pneumonia likely in lower lobe, poorly localized on lateral view.  Remaining lungs clear.  No pleural effusion or pneumothorax.  Bones demineralized.  IMPRESSION: LEFT basilar infiltrate likely representing pneumonia, probably in LEFT lower lobe   Electronically Signed   By: Ulyses Southward M.D.   On: 04/10/2014 22:38    Microbiology: Recent Results (from the past 240 hour(s))  Culture, blood (routine x 2)     Status: None (Preliminary result)   Collection Time:  04/11/14  7:05 AM  Result Value Ref Range Status   Specimen Description BLOOD LEFT ANTECUBITAL  Final   Special Requests BOTTLES DRAWN AEROBIC ONLY 10CC  Final   Culture  Setup Time   Final    04/11/2014 13:24 Performed at [facility]    Culture   Final           BLOOD CULTURE RECEIVED NO GROWTH TO DATE CULTURE WILL BE HELD FOR 5 DAYS BEFORE ISSUING A FINAL NEGATIVE REPORT Performed at [facility]    Report Status PENDING  Incomplete  Culture, blood (routine x 2)     Status: None (Preliminary result)   Collection Time: 04/11/14  7:20 AM  Result Value Ref Range Status   Specimen Description BLOOD LEFT HAND  Final   Special Requests BOTTLES DRAWN AEROBIC ONLY 7CCS  Final   Culture  Setup Time   Final    04/11/2014 13:25 Performed at [facility]    Culture   Final  BLOOD CULTURE RECEIVED NO GROWTH TO DATE CULTURE WILL BE HELD FOR 5 DAYS BEFORE ISSUING A FINAL NEGATIVE REPORT Performed at [facility]    Report Status PENDING  Incomplete     Labs: Basic Metabolic Panel:  Recent Labs Lab 04/10/14 2109 04/11/14 0705  NA 138 141  K 5.3 5.0  CL 106 109  CO2 17* 17*  GLUCOSE 132* 124*  BUN 35* 32*  CREATININE 1.19* 1.10  CALCIUM 10.4 9.9   Liver Function Tests:  Recent Labs Lab 04/11/14 0705  AST 18  ALT 24  ALKPHOS 58  BILITOT 0.2*  PROT 7.1  ALBUMIN 3.7   No results for input(s): LIPASE, AMYLASE in the last 168 hours. No results for input(s): AMMONIA in the last 168 hours. CBC:  Recent Labs Lab 04/10/14 2109 04/11/14 0705  WBC 11.8* 9.2  NEUTROABS  --  6.0  HGB 12.4 11.5*  HCT 38.4 36.3  MCV 85.3 83.4  PLT 244 227   Cardiac Enzymes:  Recent Labs Lab 04/11/14 0045 04/11/14 0705 04/11/14 1309  TROPONINI <0.30 <0.30 <0.30   BNP: BNP (last 3 results) No results for input(s): PROBNP in the last 8760 hours. CBG:  Recent Labs Lab 04/11/14 0141 04/11/14 0628 04/11/14 1102  GLUCAP 178* 122* 176*    Signed:  Ayaz Sondgeroth L  [facility] 04/16/2014, 8:10 PM

## 2014-04-17 LAB — CULTURE, BLOOD (ROUTINE X 2)
Culture: NO GROWTH
Culture: NO GROWTH

## 2014-04-23 DIAGNOSIS — R091 Pleurisy: Secondary | ICD-10-CM | POA: Diagnosis not present

## 2014-04-23 DIAGNOSIS — R938 Abnormal findings on diagnostic imaging of other specified body structures: Secondary | ICD-10-CM | POA: Diagnosis not present

## 2014-04-23 DIAGNOSIS — M7062 Trochanteric bursitis, left hip: Secondary | ICD-10-CM | POA: Diagnosis not present

## 2014-06-06 DIAGNOSIS — M1612 Unilateral primary osteoarthritis, left hip: Secondary | ICD-10-CM | POA: Diagnosis not present

## 2014-06-13 DIAGNOSIS — M1612 Unilateral primary osteoarthritis, left hip: Secondary | ICD-10-CM | POA: Diagnosis not present

## 2014-06-14 ENCOUNTER — Other Ambulatory Visit: Payer: Self-pay | Admitting: Orthopedic Surgery

## 2014-06-21 ENCOUNTER — Encounter (HOSPITAL_COMMUNITY)
Admission: RE | Admit: 2014-06-21 | Discharge: 2014-06-21 | Disposition: A | Discharge status: Home / Self Care | Payer: Medicare Other | Source: Ambulatory Visit | Attending: Orthopedic Surgery | Admitting: Orthopedic Surgery

## 2014-06-21 ENCOUNTER — Encounter (HOSPITAL_COMMUNITY): Payer: Self-pay

## 2014-06-21 DIAGNOSIS — I7090 Unspecified atherosclerosis: Secondary | ICD-10-CM | POA: Diagnosis not present

## 2014-06-21 DIAGNOSIS — Z01818 Encounter for other preprocedural examination: Secondary | ICD-10-CM | POA: Diagnosis not present

## 2014-06-21 HISTORY — DX: Other specified postprocedural states: Z98.890

## 2014-06-21 HISTORY — DX: Personal history of other medical treatment: Z92.89

## 2014-06-21 HISTORY — DX: Other specified postprocedural states: R11.2

## 2014-06-21 LAB — URINE MICROSCOPIC-ADD ON

## 2014-06-21 LAB — BASIC METABOLIC PANEL
Anion gap: 5 (ref 5–15)
BUN: 35 mg/dL — AB (ref 6–23)
CO2: 24 mmol/L (ref 19–32)
Calcium: 10.3 mg/dL (ref 8.4–10.5)
Chloride: 114 mmol/L — ABNORMAL HIGH (ref 96–112)
Creatinine, Ser: 1.27 mg/dL — ABNORMAL HIGH (ref 0.50–1.10)
GFR calc Af Amer: 42 mL/min — ABNORMAL LOW (ref >90)
GFR calc non Af Amer: 37 mL/min — ABNORMAL LOW (ref >90)
Glucose, Bld: 112 mg/dL — ABNORMAL HIGH (ref 70–99)
Potassium: 6.1 mmol/L (ref 3.5–5.1)
Sodium: 143 mmol/L (ref 135–145)

## 2014-06-21 LAB — CBC WITH DIFFERENTIAL/PLATELET
Basophils Absolute: 0.1 10*3/uL (ref 0.0–0.1)
Basophils Relative: 1 % (ref 0–1)
EOS ABS: 0.3 10*3/uL (ref 0.0–0.7)
Eosinophils Relative: 3 % (ref 0–5)
HEMATOCRIT: 35.7 % — AB (ref 36.0–46.0)
Hemoglobin: 11.3 g/dL — ABNORMAL LOW (ref 12.0–15.0)
LYMPHS ABS: 3.1 10*3/uL (ref 0.7–4.0)
Lymphocytes Relative: 32 % (ref 12–46)
MCH: 27.4 pg (ref 26.0–34.0)
MCHC: 31.7 g/dL (ref 30.0–36.0)
MCV: 86.4 fL (ref 78.0–100.0)
MONO ABS: 0.5 10*3/uL (ref 0.1–1.0)
MONOS PCT: 5 % (ref 3–12)
Neutro Abs: 5.9 10*3/uL (ref 1.7–7.7)
Neutrophils Relative %: 61 % (ref 43–77)
Platelets: 291 10*3/uL (ref 150–400)
RBC: 4.13 MIL/uL (ref 3.87–5.11)
RDW: 15.8 % — ABNORMAL HIGH (ref 11.5–15.5)
WBC: 9.8 10*3/uL (ref 4.0–10.5)

## 2014-06-21 LAB — URINALYSIS, ROUTINE W REFLEX MICROSCOPIC
Bilirubin Urine: NEGATIVE
Glucose, UA: NEGATIVE mg/dL
HGB URINE DIPSTICK: NEGATIVE
Ketones, ur: NEGATIVE mg/dL
NITRITE: NEGATIVE
Protein, ur: NEGATIVE mg/dL
SPECIFIC GRAVITY, URINE: 1.018 (ref 1.005–1.030)
Urobilinogen, UA: 0.2 mg/dL (ref 0.0–1.0)
pH: 5 (ref 5.0–8.0)

## 2014-06-21 LAB — APTT: aPTT: 32 seconds (ref 24–37)

## 2014-06-21 LAB — TYPE AND SCREEN
ABO/RH(D): A POS
ANTIBODY SCREEN: NEGATIVE

## 2014-06-21 LAB — GLUCOSE, CAPILLARY: Glucose-Capillary: 129 mg/dL — ABNORMAL HIGH (ref 70–99)

## 2014-06-21 LAB — PROTIME-INR
INR: 0.96 (ref 0.00–1.49)
Prothrombin Time: 12.9 seconds (ref 11.6–15.2)

## 2014-06-21 LAB — ABO/RH: ABO/RH(D): A POS

## 2014-06-21 NOTE — Pre-Procedure Instructions (Signed)
Haley Campbell  06/21/2014   Your procedure is scheduled on:  Monday, February 8th  [facility]mitting at 845 AM.  Call this number if you have problems the morning of surgery: [phone]   Remember:   Do not eat food or drink liquids after midnight.   Take these medicines the morning of surgery with A SIP OF WATER: xanax if needed, neurontin, ultracet if needed  Stop taking aspirin, OTC vitamins/herbal medications, NSAIDS (ibuprofen, advil, motrin) 7 days prior to surgery.    Do not wear jewelry, make-up or nail polish.  Do not wear lotions, powders, or perfume, deodorant.  Do not shave 48 hours prior to surgery. Men may shave face and neck.  Do not bring valuables to the hospital.  [facility]  for any belongings or valuables.               Contacts, dentures or bridgework may not be worn into surgery.  Leave suitcase in the car. After surgery it may be brought to your room.  For patients admitted to the hospital, discharge time is determined by your treatment team.        Please read over the following fact sheets that you were given: Pain Booklet, Coughing and Deep Breathing, Blood Transfusion Information, MRSA Information and Surgical Site Infection Prevention  [place] - Preparing for Surgery  Before surgery, you can play an important role.  Because skin is not sterile, your skin needs to be as free of germs as possible.  You can reduce the number of germs on you skin by washing with CHG (chlorahexidine gluconate) soap before surgery.  CHG is an antiseptic cleaner which kills germs and bonds with the skin to continue killing germs even after washing.  Please DO NOT use if you have an allergy to CHG or antibacterial soaps.  If your skin becomes reddened/irritated stop using the CHG and inform your nurse when you arrive at Short Stay.  Do not shave (including legs and underarms) for at least 48 hours prior to the first CHG shower.  You  may shave your face.  Please follow these instructions carefully:   1.  Shower with CHG Soap the night before surgery and the morning of Surgery.  2.  If you choose to wash your hair, wash your hair first as usual with your normal shampoo.  3.  After you shampoo, rinse your hair and body thoroughly to remove the shampoo.  4.  Use CHG as you would any other liquid soap.  You can apply CHG directly to the skin and wash gently with scrungie or a clean washcloth.  5.  Apply the CHG Soap to your body ONLY FROM THE NECK DOWN.  Do not use on open wounds or open sores.  Avoid contact with your eyes, ears, mouth and genitals (private parts).  Wash genitals (private parts) with your normal soap.  6.  Wash thoroughly, paying special attention to the area where your surgery will be performed.  7.  Thoroughly rinse your body with warm water from the neck down.  8.  DO NOT shower/wash with your normal soap after using and rinsing off the CHG Soap.  9.  Pat yourself dry with a clean towel.            10.  Wear clean pajamas.            11.  Place clean sheets on your bed the night  of your first shower and do not sleep with pets.  Day of Surgery  Do not apply any lotions/deoderants the morning of surgery.  Please wear clean clothes to the hospital/surgery center.

## 2014-06-21 NOTE — Progress Notes (Signed)
Primary - dr. Kevan NyGates No cardiologist ekg 2015 in epic

## 2014-06-21 NOTE — Progress Notes (Signed)
Call to Dr. Wadie Lessenowan's office, spoke with staff & they asked that a call be made to [employer], [place], call made & contacted [employer], lab result furnished as well as the pt. Phone no.

## 2014-06-21 NOTE — Progress Notes (Signed)
Call from [facility], in the lab with a critical care Lab value, K+ 6.1

## 2014-06-22 LAB — HEMOGLOBIN A1C
Hgb A1c MFr Bld: 6.6 % — ABNORMAL HIGH (ref 4.8–5.6)
Mean Plasma Glucose: 143 mg/dL

## 2014-06-22 LAB — SURGICAL PCR SCREEN
MRSA, PCR: NEGATIVE
STAPHYLOCOCCUS AUREUS: POSITIVE — AB

## 2014-06-24 DIAGNOSIS — E875 Hyperkalemia: Secondary | ICD-10-CM | POA: Diagnosis not present

## 2014-06-24 NOTE — Progress Notes (Signed)
I called a prescription for Mupirocin ointment to [facility]. 

## 2014-06-24 NOTE — Progress Notes (Signed)
Anesthesia Chart Review:  Pt is 81 year old female scheduled for L total hip arthroplasty on 07/01/2014 with Dr. Turner Danielsowan.   PCP is Dr. Kevan NyGates.   PMH includes: HTN, DM  Preoperative labs reviewed.  K 6.1. Cr 1.27, BUN 35.   Pt saw PCP today. Instructed to avoid high potassium foods, and drink lots of water. Recheck at PCP's office today, K was 5.5. Will recheck K DOS.   EKG 04/10/2014: Sinus rhythm. LVH by voltage. Inferior infarct, old. Anterior Q waves, possibly due to LVH. Baseline wander in lead(s) I III aVL. No significant change since last tracing  Echo 04/11/2014: - Left ventricle: The cavity size was normal. Wall thickness was increased in a pattern of moderate LVH. Systolic function was normal. The estimated ejection fraction was in the range of 60% to 65%. Wall motion was normal; there were no regional wall motion abnormalities. Doppler parameters are consistent with abnormal left ventricular relaxation (grade 1 diastolic dysfunction). - Aortic valve: Trileaflet; normal thickness, mildly calcified leaflets. - Mitral valve: Mildly to moderately calcified annulus.   Discussed with Dr. Michelle Piperssey.   If no changes, I anticipate pt can proceed with surgery as scheduled.   Rica Mastngela Tyse Auriemma, FNP-BC [facility]Anesthesiology Phone: [phone] 06/24/2014 4:31 PM

## 2014-06-27 DIAGNOSIS — E875 Hyperkalemia: Secondary | ICD-10-CM | POA: Diagnosis not present

## 2014-06-28 NOTE — H&P (Signed)
TOTAL HIP ADMISSION H&P  Patient is admitted for left total hip arthroplasty.  Subjective:  Chief Complaint: left hip pain  HPI: Haley Campbell, 81 y.o. female, has a history of pain and functional disability in the left hip(s) due to arthritis and patient has failed non-surgical conservative treatments for greater than 12 weeks to include NSAID's and/or analgesics, flexibility and strengthening excercises, supervised PT with diminished ADL's post treatment, use of assistive devices, weight reduction as appropriate and activity modification.  Onset of symptoms was gradual starting several years ago with gradually worsening course since that time.The patient noted no past surgery on the left hip(s).  Patient currently rates pain in the left hip at 10 out of 10 with activity. Patient has night pain, worsening of pain with activity and weight bearing, trendelenberg gait, pain that interfers with activities of daily living, pain with passive range of motion and crepitus. Patient has evidence of periarticular osteophytes and joint space narrowing by imaging studies. This condition presents safety issues increasing the risk of falls. There is no current active infection.  Patient Active Problem List   Diagnosis Date Noted  . Chest pain 04/11/2014  . Hypertension   . Dyspepsia   . Diabetes mellitus   . CAP (community acquired pneumonia) 04/10/2014  . Headache(784.0) 10/23/2013   Past Medical History  Diagnosis Date  . Tinnitus of both ears     Chronic severe, and mixed conductive and sensorineural hearing loss  . Hypertension   . Diverticulosis     with episodic diverticulitis  . Low back pain     Chronic  . Hypertension   . Insomnia     severe  . Gout   . Anxiety   . Depression   . HA (headache)     chronic  . Dyspepsia     persistent  . Degenerative disc disease     knees  . Allergic rhinitis   . Otosclerosis     with left stapedectomy  . Sciatica     left lower extremity   .  PONV (postoperative nausea and vomiting)   . Diabetes mellitus     type 2 - fasting cbg 100-130  . History of blood transfusion     Past Surgical History  Procedure Laterality Date  . Stapedectomy Left     middle ear  . Total knee arthroplasty Right   . Cholecystectomy    . Total abdominal hysterectomy    . Appendectomy    . Breast reduction surgery Bilateral   . Eye surgery Bilateral     cataracts    No prescriptions prior to admission   Allergies  Allergen Reactions  . Latex Other (See Comments)    Severe mucosal allergy  . Codeine Nausea Only  . Colchicine Diarrhea and Nausea And Vomiting  . Hctz [Hydrochlorothiazide] Nausea Only and Other (See Comments)    Fever and Headache  . Meperidine     Other reaction(s): RASH  . Sonata [Zaleplon] Nausea Only and Other (See Comments)    No sleep  . Sulfa Antibiotics Rash    History  Substance Use Topics  . Smoking status: Never Smoker   . Smokeless tobacco: Never Used  . Alcohol Use: No    Family History  Problem Relation Age of Onset  . CAD Father   . CVA Mother   . Hypertension Brother   . Prostate cancer Brother   . Cancer Sister     BRAIN TUMOR     Review  of Systems  Constitutional: Negative.   HENT: Positive for hearing loss.   Eyes: Negative.   Respiratory: Negative.   Cardiovascular: Negative.   Musculoskeletal: Positive for myalgias and joint pain.  Skin: Negative.   Neurological: Negative.   Endo/Heme/Allergies: Bruises/bleeds easily.  Psychiatric/Behavioral: Negative.     Objective:  Physical Exam  Constitutional: She is oriented to person, place, and time. She appears well-developed and well-nourished.  HENT:  Head: Normocephalic and atraumatic.  Eyes: Pupils are equal, round, and reactive to light.  Neck: Normal range of motion. Neck supple.  Cardiovascular: Intact distal pulses.   Respiratory: Effort normal.  Musculoskeletal: She exhibits tenderness.  Patient walks with a left-sided  antalgic and Trendelenburg gait, any attempted internal or external rotation of the left hip causes severe pain, foot tap is negative.  Her skin is intact she is otherwise neurovascularly intact.  Her x-rays are reviewed and are as described above.  Neurological: She is alert and oriented to person, place, and time.  Skin: Skin is warm and dry.  Psychiatric: She has a normal mood and affect. Her behavior is normal. Judgment and thought content normal.    Vital signs in last 24 hours:    Labs:   Estimated body mass index is 25.66 kg/(m^2) as calculated from the following:   Height as of 04/11/14:  (1.6 m).   Weight as of 04/10/14: 65.7 kg (144 lb 13.5 oz).   Imaging Review Plain radiographs demonstrate AP pelvis and lateral of the left hip shows severe end stage left hip primary localized osteoarthritis with protrusio.  Assessment/Plan:  End stage arthritis, left hip(s)  The patient history, physical examination, clinical judgement of the provider and imaging studies are consistent with end stage degenerative joint disease of the left hip(s) and total hip arthroplasty is deemed medically necessary. The treatment options including medical management, injection therapy, arthroscopy and arthroplasty were discussed at length. The risks and benefits of total hip arthroplasty were presented and reviewed. The risks due to aseptic loosening, infection, stiffness, dislocation/subluxation,  thromboembolic complications and other imponderables were discussed.  The patient acknowledged the explanation, agreed to proceed with the plan and consent was signed. Patient is being admitted for inpatient treatment for surgery, pain control, PT, OT, prophylactic antibiotics, VTE prophylaxis, progressive ambulation and ADL's and discharge planning.The patient is planning to be discharged to skilled nursing facility

## 2014-06-30 MED ORDER — CEFAZOLIN SODIUM-DEXTROSE 2-3 GM-% IV SOLR
2.0000 g | INTRAVENOUS | Status: AC
  Administered 2014-07-01: 2 g via INTRAVENOUS
  Filled 2014-06-30: qty 50

## 2014-06-30 MED ORDER — TRANEXAMIC ACID 100 MG/ML IV SOLN
1000.0000 mg | INTRAVENOUS | Status: AC
  Administered 2014-07-01: 1000 mg via INTRAVENOUS
  Filled 2014-06-30: qty 10

## 2014-06-30 MED ORDER — DEXTROSE-NACL 5-0.45 % IV SOLN: INTRAVENOUS | Status: DC

## 2014-07-01 ENCOUNTER — Encounter (HOSPITAL_COMMUNITY)
Admission: RE | Disposition: A | Discharge status: Skilled Nursing Facility | Payer: Self-pay | Source: Ambulatory Visit | Attending: Orthopedic Surgery

## 2014-07-01 ENCOUNTER — Inpatient Hospital Stay (HOSPITAL_COMMUNITY): Payer: Medicare Other

## 2014-07-01 ENCOUNTER — Inpatient Hospital Stay (HOSPITAL_COMMUNITY): Payer: Medicare Other | Admitting: Certified Registered"

## 2014-07-01 ENCOUNTER — Inpatient Hospital Stay (HOSPITAL_COMMUNITY): Payer: Medicare Other | Admitting: Vascular Surgery

## 2014-07-01 ENCOUNTER — Encounter (HOSPITAL_COMMUNITY): Payer: Self-pay | Admitting: *Deleted

## 2014-07-01 ENCOUNTER — Inpatient Hospital Stay (HOSPITAL_COMMUNITY)
Admission: RE | Admit: 2014-07-01 | Discharge: 2014-07-03 | DRG: 470 | Disposition: A | Discharge status: Skilled Nursing Facility | Payer: Medicare Other | Source: Ambulatory Visit | Attending: Orthopedic Surgery | Admitting: Orthopedic Surgery

## 2014-07-01 DIAGNOSIS — Z888 Allergy status to other drugs, medicaments and biological substances status: Secondary | ICD-10-CM

## 2014-07-01 DIAGNOSIS — Z91040 Latex allergy status: Secondary | ICD-10-CM

## 2014-07-01 DIAGNOSIS — E119 Type 2 diabetes mellitus without complications: Secondary | ICD-10-CM | POA: Diagnosis present

## 2014-07-01 DIAGNOSIS — Z96642 Presence of left artificial hip joint: Secondary | ICD-10-CM | POA: Diagnosis not present

## 2014-07-01 DIAGNOSIS — Z885 Allergy status to narcotic agent status: Secondary | ICD-10-CM

## 2014-07-01 DIAGNOSIS — J309 Allergic rhinitis, unspecified: Secondary | ICD-10-CM | POA: Diagnosis not present

## 2014-07-01 DIAGNOSIS — Z96651 Presence of right artificial knee joint: Secondary | ICD-10-CM | POA: Diagnosis present

## 2014-07-01 DIAGNOSIS — G4700 Insomnia, unspecified: Secondary | ICD-10-CM | POA: Diagnosis not present

## 2014-07-01 DIAGNOSIS — F419 Anxiety disorder, unspecified: Secondary | ICD-10-CM | POA: Diagnosis present

## 2014-07-01 DIAGNOSIS — M1612 Unilateral primary osteoarthritis, left hip: Principal | ICD-10-CM | POA: Diagnosis present

## 2014-07-01 DIAGNOSIS — I10 Essential (primary) hypertension: Secondary | ICD-10-CM | POA: Diagnosis present

## 2014-07-01 DIAGNOSIS — M519 Unspecified thoracic, thoracolumbar and lumbosacral intervertebral disc disorder: Secondary | ICD-10-CM | POA: Diagnosis not present

## 2014-07-01 DIAGNOSIS — Z8249 Family history of ischemic heart disease and other diseases of the circulatory system: Secondary | ICD-10-CM

## 2014-07-01 DIAGNOSIS — Z7982 Long term (current) use of aspirin: Secondary | ICD-10-CM

## 2014-07-01 DIAGNOSIS — D62 Acute posthemorrhagic anemia: Secondary | ICD-10-CM | POA: Diagnosis not present

## 2014-07-01 DIAGNOSIS — M25552 Pain in left hip: Secondary | ICD-10-CM | POA: Diagnosis not present

## 2014-07-01 DIAGNOSIS — Z882 Allergy status to sulfonamides status: Secondary | ICD-10-CM

## 2014-07-01 DIAGNOSIS — F411 Generalized anxiety disorder: Secondary | ICD-10-CM | POA: Diagnosis not present

## 2014-07-01 DIAGNOSIS — F329 Major depressive disorder, single episode, unspecified: Secondary | ICD-10-CM | POA: Diagnosis present

## 2014-07-01 DIAGNOSIS — M5442 Lumbago with sciatica, left side: Secondary | ICD-10-CM | POA: Diagnosis present

## 2014-07-01 DIAGNOSIS — Z823 Family history of stroke: Secondary | ICD-10-CM | POA: Diagnosis not present

## 2014-07-01 DIAGNOSIS — M1610 Unilateral primary osteoarthritis, unspecified hip: Secondary | ICD-10-CM | POA: Diagnosis present

## 2014-07-01 DIAGNOSIS — Q782 Osteopetrosis: Secondary | ICD-10-CM | POA: Diagnosis not present

## 2014-07-01 DIAGNOSIS — G8929 Other chronic pain: Secondary | ICD-10-CM | POA: Diagnosis present

## 2014-07-01 DIAGNOSIS — R1013 Epigastric pain: Secondary | ICD-10-CM | POA: Diagnosis not present

## 2014-07-01 DIAGNOSIS — Z471 Aftercare following joint replacement surgery: Secondary | ICD-10-CM | POA: Diagnosis not present

## 2014-07-01 DIAGNOSIS — M169 Osteoarthritis of hip, unspecified: Secondary | ICD-10-CM | POA: Diagnosis not present

## 2014-07-01 DIAGNOSIS — Z79899 Other long term (current) drug therapy: Secondary | ICD-10-CM | POA: Diagnosis not present

## 2014-07-01 DIAGNOSIS — H906 Mixed conductive and sensorineural hearing loss, bilateral: Secondary | ICD-10-CM | POA: Diagnosis present

## 2014-07-01 DIAGNOSIS — H9313 Tinnitus, bilateral: Secondary | ICD-10-CM | POA: Diagnosis present

## 2014-07-01 DIAGNOSIS — M545 Low back pain: Secondary | ICD-10-CM | POA: Diagnosis not present

## 2014-07-01 DIAGNOSIS — M109 Gout, unspecified: Secondary | ICD-10-CM | POA: Diagnosis not present

## 2014-07-01 HISTORY — PX: TOTAL HIP ARTHROPLASTY: SHX124

## 2014-07-01 LAB — POCT I-STAT 4, (NA,K, GLUC, HGB,HCT)
GLUCOSE: 122 mg/dL — AB (ref 70–99)
HCT: 35 % — ABNORMAL LOW (ref 36.0–46.0)
Hemoglobin: 11.9 g/dL — ABNORMAL LOW (ref 12.0–15.0)
Potassium: 4.7 mmol/L (ref 3.5–5.1)
Sodium: 139 mmol/L (ref 135–145)

## 2014-07-01 SURGERY — ARTHROPLASTY, HIP, TOTAL,POSTERIOR APPROACH
Anesthesia: Spinal | Laterality: Left

## 2014-07-01 MED ORDER — FENTANYL CITRATE 0.05 MG/ML IJ SOLN
25.0000 ug | INTRAMUSCULAR | Status: DC | PRN
  Administered 2014-07-01: 50 ug via INTRAVENOUS
  Administered 2014-07-01 (×2): 25 ug via INTRAVENOUS

## 2014-07-01 MED ORDER — SENNOSIDES-DOCUSATE SODIUM 8.6-50 MG PO TABS: 1.0000 | ORAL_TABLET | Freq: Every evening | ORAL | Status: DC | PRN

## 2014-07-01 MED ORDER — DIPHENHYDRAMINE HCL 12.5 MG/5ML PO ELIX: 12.5000 mg | ORAL_SOLUTION | ORAL | Status: DC | PRN

## 2014-07-01 MED ORDER — FENTANYL CITRATE 0.05 MG/ML IJ SOLN
INTRAMUSCULAR | Status: AC
  Administered 2014-07-01: 25 ug via INTRAVENOUS
  Filled 2014-07-01: qty 2

## 2014-07-01 MED ORDER — OXYCODONE HCL 5 MG PO TABS
5.0000 mg | ORAL_TABLET | ORAL | Status: DC | PRN
  Administered 2014-07-01 – 2014-07-03 (×9): 10 mg via ORAL
  Filled 2014-07-01 (×8): qty 2

## 2014-07-01 MED ORDER — CHLORHEXIDINE GLUCONATE 4 % EX LIQD: 60.0000 mL | Freq: Once | CUTANEOUS | Status: DC

## 2014-07-01 MED ORDER — BUPIVACAINE HCL (PF) 0.5 % IJ SOLN
INTRAMUSCULAR | Status: DC | PRN
  Administered 2014-07-01: 12 mg

## 2014-07-01 MED ORDER — LISINOPRIL 10 MG PO TABS
10.0000 mg | ORAL_TABLET | Freq: Every day | ORAL | Status: DC
  Administered 2014-07-01 – 2014-07-03 (×2): 10 mg via ORAL
  Filled 2014-07-01 (×4): qty 1

## 2014-07-01 MED ORDER — PROPOFOL INFUSION 10 MG/ML OPTIME
INTRAVENOUS | Status: DC | PRN
  Administered 2014-07-01: 50 ug/kg/min via INTRAVENOUS

## 2014-07-01 MED ORDER — LACTATED RINGERS IV SOLN
INTRAVENOUS | Status: DC
  Administered 2014-07-01: 09:00:00 via INTRAVENOUS

## 2014-07-01 MED ORDER — ASPIRIN EC 325 MG PO TBEC: 325.0000 mg | DELAYED_RELEASE_TABLET | Freq: Two times a day (BID) | ORAL | Status: DC

## 2014-07-01 MED ORDER — DOCUSATE SODIUM 100 MG PO CAPS
100.0000 mg | ORAL_CAPSULE | Freq: Two times a day (BID) | ORAL | Status: DC
  Administered 2014-07-01 – 2014-07-03 (×4): 100 mg via ORAL
  Filled 2014-07-01 (×7): qty 1

## 2014-07-01 MED ORDER — FENTANYL CITRATE 0.05 MG/ML IJ SOLN
INTRAMUSCULAR | Status: DC | PRN
  Administered 2014-07-01 (×2): 50 ug via INTRAVENOUS

## 2014-07-01 MED ORDER — ZOLPIDEM TARTRATE 5 MG PO TABS: 5.0000 mg | ORAL_TABLET | Freq: Every evening | ORAL | Status: DC | PRN

## 2014-07-01 MED ORDER — DEXAMETHASONE SODIUM PHOSPHATE 4 MG/ML IJ SOLN
INTRAMUSCULAR | Status: DC | PRN
  Administered 2014-07-01: 4 mg via INTRAVENOUS

## 2014-07-01 MED ORDER — METFORMIN HCL 500 MG PO TABS
500.0000 mg | ORAL_TABLET | Freq: Two times a day (BID) | ORAL | Status: DC
  Administered 2014-07-01 – 2014-07-03 (×4): 500 mg via ORAL
  Filled 2014-07-01 (×6): qty 1

## 2014-07-01 MED ORDER — ARTIFICIAL TEARS OP OINT
TOPICAL_OINTMENT | OPHTHALMIC | Status: AC
  Filled 2014-07-01: qty 3.5

## 2014-07-01 MED ORDER — PHENOL 1.4 % MT LIQD: 1.0000 | OROMUCOSAL | Status: DC | PRN

## 2014-07-01 MED ORDER — CEFUROXIME SODIUM 1.5 G IJ SOLR
INTRAMUSCULAR | Status: DC | PRN
  Administered 2014-07-01: 1.5 g

## 2014-07-01 MED ORDER — FENTANYL CITRATE 0.05 MG/ML IJ SOLN
INTRAMUSCULAR | Status: AC
  Filled 2014-07-01: qty 5

## 2014-07-01 MED ORDER — HYDROMORPHONE HCL 1 MG/ML IJ SOLN
0.5000 mg | INTRAMUSCULAR | Status: DC | PRN
  Administered 2014-07-01 – 2014-07-02 (×3): 0.5 mg via INTRAVENOUS
  Filled 2014-07-01 (×3): qty 1

## 2014-07-01 MED ORDER — ASPIRIN EC 325 MG PO TBEC
325.0000 mg | DELAYED_RELEASE_TABLET | Freq: Every day | ORAL | Status: DC
  Administered 2014-07-02 – 2014-07-03 (×2): 325 mg via ORAL
  Filled 2014-07-01 (×3): qty 1

## 2014-07-01 MED ORDER — ALPRAZOLAM 0.5 MG PO TABS
0.5000 mg | ORAL_TABLET | Freq: Every evening | ORAL | Status: DC | PRN
  Administered 2014-07-01: 0.5 mg via ORAL
  Filled 2014-07-01: qty 1

## 2014-07-01 MED ORDER — METOCLOPRAMIDE HCL 10 MG PO TABS: 5.0000 mg | ORAL_TABLET | Freq: Three times a day (TID) | ORAL | Status: DC | PRN

## 2014-07-01 MED ORDER — PHENYLEPHRINE 40 MCG/ML (10ML) SYRINGE FOR IV PUSH (FOR BLOOD PRESSURE SUPPORT)
PREFILLED_SYRINGE | INTRAVENOUS | Status: AC
  Filled 2014-07-01: qty 20

## 2014-07-01 MED ORDER — TIZANIDINE HCL 2 MG PO CAPS: 2.0000 mg | ORAL_CAPSULE | Freq: Three times a day (TID) | ORAL | Status: DC

## 2014-07-01 MED ORDER — FLEET ENEMA 7-19 GM/118ML RE ENEM: 1.0000 | ENEMA | Freq: Once | RECTAL | Status: AC | PRN

## 2014-07-01 MED ORDER — BUPIVACAINE-EPINEPHRINE 0.5% -1:200000 IJ SOLN
INTRAMUSCULAR | Status: DC | PRN
  Administered 2014-07-01: 10 mL

## 2014-07-01 MED ORDER — METHOCARBAMOL 500 MG PO TABS
500.0000 mg | ORAL_TABLET | Freq: Four times a day (QID) | ORAL | Status: DC | PRN
  Administered 2014-07-01 – 2014-07-02 (×2): 500 mg via ORAL
  Filled 2014-07-01 (×3): qty 1

## 2014-07-01 MED ORDER — ONDANSETRON HCL 4 MG PO TABS: 4.0000 mg | ORAL_TABLET | Freq: Four times a day (QID) | ORAL | Status: DC | PRN

## 2014-07-01 MED ORDER — OXYCODONE-ACETAMINOPHEN 5-325 MG PO TABS: 1.0000 | ORAL_TABLET | ORAL | Status: DC | PRN

## 2014-07-01 MED ORDER — LATANOPROST 0.005 % OP SOLN
1.0000 [drp] | Freq: Every day | OPHTHALMIC | Status: DC
  Administered 2014-07-01 – 2014-07-02 (×2): 1 [drp] via OPHTHALMIC
  Filled 2014-07-01: qty 2.5

## 2014-07-01 MED ORDER — METHOCARBAMOL 1000 MG/10ML IJ SOLN
500.0000 mg | Freq: Four times a day (QID) | INTRAVENOUS | Status: DC | PRN
  Filled 2014-07-01: qty 5

## 2014-07-01 MED ORDER — ACETAMINOPHEN 650 MG RE SUPP
650.0000 mg | Freq: Four times a day (QID) | RECTAL | Status: DC | PRN
Start: 2014-07-01 — End: 2014-07-03

## 2014-07-01 MED ORDER — KCL IN DEXTROSE-NACL 20-5-0.45 MEQ/L-%-% IV SOLN
INTRAVENOUS | Status: DC
  Administered 2014-07-01: via INTRAVENOUS
  Filled 2014-07-01 (×7): qty 1000

## 2014-07-01 MED ORDER — ONDANSETRON HCL 4 MG/2ML IJ SOLN
4.0000 mg | Freq: Four times a day (QID) | INTRAMUSCULAR | Status: DC | PRN
  Administered 2014-07-02: 4 mg via INTRAVENOUS
  Filled 2014-07-01: qty 2

## 2014-07-01 MED ORDER — PHENYLEPHRINE HCL 10 MG/ML IJ SOLN
INTRAMUSCULAR | Status: DC | PRN
  Administered 2014-07-01 (×3): 40 ug via INTRAVENOUS

## 2014-07-01 MED ORDER — ALUMINUM HYDROXIDE GEL 320 MG/5ML PO SUSP
15.0000 mL | ORAL | Status: DC | PRN
  Filled 2014-07-01: qty 30

## 2014-07-01 MED ORDER — ONDANSETRON HCL 4 MG/2ML IJ SOLN
INTRAMUSCULAR | Status: AC
  Filled 2014-07-01: qty 2

## 2014-07-01 MED ORDER — INSULIN ASPART 100 UNIT/ML ~~LOC~~ SOLN: 0.0000 [IU] | Freq: Three times a day (TID) | SUBCUTANEOUS | Status: DC

## 2014-07-01 MED ORDER — DEXAMETHASONE SODIUM PHOSPHATE 4 MG/ML IJ SOLN
INTRAMUSCULAR | Status: AC
  Filled 2014-07-01: qty 1

## 2014-07-01 MED ORDER — ACETAMINOPHEN 325 MG PO TABS
650.0000 mg | ORAL_TABLET | Freq: Four times a day (QID) | ORAL | Status: DC | PRN
Start: 2014-07-01 — End: 2014-07-03

## 2014-07-01 MED ORDER — INSULIN ASPART 100 UNIT/ML ~~LOC~~ SOLN
0.0000 [IU] | Freq: Three times a day (TID) | SUBCUTANEOUS | Status: DC
Start: 2014-07-01 — End: 2014-07-03
  Administered 2014-07-02: 3 [IU] via SUBCUTANEOUS
  Administered 2014-07-02: 2 [IU] via SUBCUTANEOUS

## 2014-07-01 MED ORDER — BISACODYL 5 MG PO TBEC: 5.0000 mg | DELAYED_RELEASE_TABLET | Freq: Every day | ORAL | Status: DC | PRN

## 2014-07-01 MED ORDER — MENTHOL 3 MG MT LOZG: 1.0000 | LOZENGE | OROMUCOSAL | Status: DC | PRN

## 2014-07-01 MED ORDER — BUPIVACAINE-EPINEPHRINE (PF) 0.5% -1:200000 IJ SOLN
INTRAMUSCULAR | Status: AC
  Filled 2014-07-01: qty 30

## 2014-07-01 MED ORDER — LACTATED RINGERS IV SOLN
INTRAVENOUS | Status: DC | PRN
  Administered 2014-07-01: 10:00:00 via INTRAVENOUS

## 2014-07-01 MED ORDER — CEFUROXIME SODIUM 1.5 G IJ SOLR
INTRAMUSCULAR | Status: AC
  Filled 2014-07-01: qty 1.5

## 2014-07-01 MED ORDER — ONDANSETRON HCL 4 MG/2ML IJ SOLN
INTRAMUSCULAR | Status: DC | PRN
  Administered 2014-07-01: 4 mg via INTRAVENOUS

## 2014-07-01 MED ORDER — METOCLOPRAMIDE HCL 5 MG/ML IJ SOLN: 5.0000 mg | Freq: Three times a day (TID) | INTRAMUSCULAR | Status: DC | PRN

## 2014-07-01 SURGICAL SUPPLY — 61 items
BLADE SAW SGTL 18X1.27X75 (BLADE) ×2 IMPLANT
BLADE SAW SGTL 18X1.27X75MM (BLADE) ×1
BRUSH FEMORAL CANAL (MISCELLANEOUS) ×2 IMPLANT
CAPT HIP TOTAL 2 ×2 IMPLANT
CEMENT BONE DEPUY (Cement) ×2 IMPLANT
CEMENT RESTRICTOR DEPUY SZ 3 (Cement) ×2 IMPLANT
COVER BACK TABLE 24X17X13 BIG (DRAPES) IMPLANT
COVER SURGICAL LIGHT HANDLE (MISCELLANEOUS) ×4 IMPLANT
DRAPE IMP U-DRAPE 54X76 (DRAPES) ×3 IMPLANT
DRAPE ORTHO SPLIT 77X108 STRL (DRAPES) ×3
DRAPE PROXIMA HALF (DRAPES) ×3 IMPLANT
DRAPE SURG ORHT 6 SPLT 77X108 (DRAPES) ×1 IMPLANT
DRAPE U-SHAPE 47X51 STRL (DRAPES) ×3 IMPLANT
DRILL BIT 7/64X5 (BIT) ×3 IMPLANT
DRSG AQUACEL AG ADV 3.5X10 (GAUZE/BANDAGES/DRESSINGS) ×3 IMPLANT
DURAPREP 26ML APPLICATOR (WOUND CARE) ×5 IMPLANT
ELECT BLADE 4.0 EZ CLEAN MEGAD (MISCELLANEOUS) ×3
ELECT REM PT RETURN 9FT ADLT (ELECTROSURGICAL) ×3
ELECTRODE BLDE 4.0 EZ CLN MEGD (MISCELLANEOUS) IMPLANT
ELECTRODE REM PT RTRN 9FT ADLT (ELECTROSURGICAL) ×1 IMPLANT
GLOVE BIO SURGEON STRL SZ7.5 (GLOVE) ×1 IMPLANT
GLOVE BIO SURGEON STRL SZ8.5 (GLOVE) ×2 IMPLANT
GLOVE BIOGEL PI IND STRL 8 (GLOVE) ×2 IMPLANT
GLOVE BIOGEL PI IND STRL 9 (GLOVE) ×1 IMPLANT
GLOVE BIOGEL PI INDICATOR 8 (GLOVE) ×2
GLOVE BIOGEL PI INDICATOR 9 (GLOVE)
GLOVE SURG SS PI 6.5 STRL IVOR (GLOVE) ×2 IMPLANT
GLOVE SURG SS PI 8.0 STRL IVOR (GLOVE) ×2 IMPLANT
GLOVE SURG SS PI 8.5 STRL IVOR (GLOVE) ×2
GLOVE SURG SS PI 8.5 STRL STRW (GLOVE) IMPLANT
GOWN STRL REUS W/ TWL LRG LVL3 (GOWN DISPOSABLE) ×2 IMPLANT
GOWN STRL REUS W/ TWL XL LVL3 (GOWN DISPOSABLE) ×3 IMPLANT
GOWN STRL REUS W/TWL LRG LVL3 (GOWN DISPOSABLE) ×6
GOWN STRL REUS W/TWL XL LVL3 (GOWN DISPOSABLE) ×3
HANDPIECE INTERPULSE COAX TIP (DISPOSABLE) ×3
HOOD PEEL AWAY FACE SHEILD DIS (HOOD) ×6 IMPLANT
KIT BASIN OR (CUSTOM PROCEDURE TRAY) ×3 IMPLANT
KIT ROOM TURNOVER OR (KITS) ×3 IMPLANT
MANIFOLD NEPTUNE II (INSTRUMENTS) ×3 IMPLANT
NEEDLE 22X1 1/2 (OR ONLY) (NEEDLE) ×3 IMPLANT
NS IRRIG 1000ML POUR BTL (IV SOLUTION) ×3 IMPLANT
PACK TOTAL JOINT (CUSTOM PROCEDURE TRAY) ×3 IMPLANT
PACK UNIVERSAL I (CUSTOM PROCEDURE TRAY) ×3 IMPLANT
PAD ARMBOARD 7.5X6 YLW CONV (MISCELLANEOUS) ×6 IMPLANT
PASSER SUT SWANSON 36MM LOOP (INSTRUMENTS) ×3 IMPLANT
PRESSURIZER FEMORAL UNIV (MISCELLANEOUS) ×2 IMPLANT
SET HNDPC FAN SPRY TIP SCT (DISPOSABLE) IMPLANT
SUT ETHIBOND 2 V 37 (SUTURE) ×3 IMPLANT
SUT VIC AB 0 CTB1 27 (SUTURE) ×3 IMPLANT
SUT VIC AB 1 CTX 36 (SUTURE) ×3
SUT VIC AB 1 CTX36XBRD ANBCTR (SUTURE) ×1 IMPLANT
SUT VIC AB 2-0 CTB1 (SUTURE) ×5 IMPLANT
SUT VIC AB 3-0 SH 27 (SUTURE) ×3
SUT VIC AB 3-0 SH 27X BRD (SUTURE) ×1 IMPLANT
SYR CONTROL 10ML LL (SYRINGE) ×3 IMPLANT
TOWEL OR 17X24 6PK STRL BLUE (TOWEL DISPOSABLE) ×3 IMPLANT
TOWEL OR 17X26 10 PK STRL BLUE (TOWEL DISPOSABLE) ×3 IMPLANT
TOWER CARTRIDGE SMART MIX (DISPOSABLE) ×2 IMPLANT
TRAY CATH 16FR W/PLASTIC CATH (SET/KITS/TRAYS/PACK) ×2 IMPLANT
TRAY FOLEY CATH 14FR (SET/KITS/TRAYS/PACK) IMPLANT
WATER STERILE IRR 1000ML POUR (IV SOLUTION) ×4 IMPLANT

## 2014-07-01 NOTE — Progress Notes (Signed)
Utilization review completed.  

## 2014-07-01 NOTE — Anesthesia Postprocedure Evaluation (Signed)
  Anesthesia Post-op Note  Patient: Haley Campbell  Procedure(s) Performed: Procedure(s): TOTAL HIP ARTHROPLASTY (Left)  Patient Location: PACU  Anesthesia Type:Spinal  Level of Consciousness: awake and alert   Airway and Oxygen Therapy: Patient Spontanous Breathing  Post-op Pain: none  Post-op Assessment: Post-op Vital signs reviewed  Post-op Vital Signs: Reviewed  Last Vitals:  Filed Vitals:   07/01/14 1524  BP:   Pulse: 92  Temp: 36.8 C  Resp: 16    Complications: No apparent anesthesia complications

## 2014-07-01 NOTE — Progress Notes (Signed)
OT Cancellation Note  Patient Details Name: Haley GalloDoris G Candela MRN: [id number] DOB: Jan 13, 1924   Cancelled Treatment:    Reason Eval/Treat Not Completed: Other (comment) Pt is Medicare and current D/C plan is SNF. No apparent immediate acute care OT needs, therefore will defer OT to SNF. If OT eval is needed please call Acute Rehab Dept. at [phone] or text page OT at [phone].  [facility],HILLARY  Lidia Clavijo, OTR/L  [phone] 07/01/2014 07/01/2014, 9:18 PM

## 2014-07-01 NOTE — Transfer of Care (Signed)
Immediate Anesthesia Transfer of Care Note  Patient: Haley Campbell  Procedure(s) Performed: Procedure(s): TOTAL HIP ARTHROPLASTY (Left)  Patient Location: PACU  Anesthesia Type:Spinal  Level of Consciousness: awake, alert  and oriented  Airway & Oxygen Therapy: Patient Spontanous Breathing  Post-op Assessment: Report given to RN and Post -op Vital signs reviewed and stable  Post vital signs: Reviewed and stable  Last Vitals:  Filed Vitals:   07/01/14 1236  BP:   Pulse:   Temp: 36.5 C  Resp:     Complications: No apparent anesthesia complications

## 2014-07-01 NOTE — Progress Notes (Signed)
CBG= 121

## 2014-07-01 NOTE — Anesthesia Preprocedure Evaluation (Addendum)
Anesthesia Evaluation  Patient identified by MRN, date of birth, ID band Patient awake    Reviewed: Allergy & Precautions, H&P , NPO status , Patient's Chart, lab work & pertinent test results  History of Anesthesia Complications (+) PONV  Airway Mallampati: II  TM Distance: >3 FB Neck ROM: Full    Dental no notable dental hx. (+) Teeth Intact, Dental Advisory Given   Pulmonary neg pulmonary ROS,  breath sounds clear to auscultation  Pulmonary exam normal       Cardiovascular hypertension, Pt. on medications Rhythm:Regular Rate:Normal     Neuro/Psych  Headaches, Anxiety Depression negative neurological ROS     GI/Hepatic negative GI ROS, Neg liver ROS,   Endo/Other  diabetes, Type 2, Oral Hypoglycemic Agents  Renal/GU negative Renal ROS  negative genitourinary   Musculoskeletal  (+) Arthritis -, Osteoarthritis,    Abdominal   Peds  Hematology negative hematology ROS (+)   Anesthesia Other Findings   Reproductive/Obstetrics negative OB ROS                            Anesthesia Physical Anesthesia Plan  ASA: II  Anesthesia Plan: Spinal   Post-op Pain Management:    Induction: Intravenous  Airway Management Planned: Simple Face Mask  Additional Equipment:   Intra-op Plan:   Post-operative Plan:   Informed Consent: I have reviewed the patients History and Physical, chart, labs and discussed the procedure including the risks, benefits and alternatives for the proposed anesthesia with the patient or authorized representative who has indicated his/her understanding and acceptance.   Dental advisory given  Plan Discussed with: CRNA and Surgeon  Anesthesia Plan Comments:        Anesthesia Quick Evaluation

## 2014-07-01 NOTE — Op Note (Signed)
PATIENT ID:      Haley Campbell  MRN:     [id number] DOB/AGE:    12/21/1923 / 81 y.o.       OPERATIVE REPORT    DATE OF PROCEDURE:  07/01/2014       PREOPERATIVE DIAGNOSIS:  LEFT HIP DEGENERATIVE JOINT DISEASE                                                       Estimated body mass index is 26.7 kg/(m^2) as calculated from the following:   Height as of this encounter: 5\' 2"  (1.575 m).   Weight as of this encounter: 66.225 kg (146 lb).     POSTOPERATIVE DIAGNOSIS:  LEFT HIP DEGENERATIVE JOINT DISEASE                                                           PROCEDURE:  L total hip arthroplasty using a 48 mm DePuy Pinnacle  Cup, [employer], 10-degree polyethylene liner index superior  and posterior, a +1.5 36 mm ceramic head, a #3 Summit basic stem, cemented double batch of DePuy HV cement with 1500 mg of Zinacef, 11 mm centralizer and #3 central canal occluder   SURGEON: Rosey Eide J    ASSISTANT:   Eric K. Gaylene Brooks  (present throughout entire procedure and necessary for timely completion of the procedure)  ANESTHESIA:  Spinal  BLOOD LOSS: * No blood loss amount entered *  FLUID REPLACEMENT: 1500 crystalloid DRAINS: COMPLICATIONS:  None    INDICATIONS FOR PROCEDURE:Patient with end-stage arthritis of the L hip.  X-rays show bone-on-bone arthritic changes. Despite conservative measures with observation, anti-inflammatory medicine, narcotics, use of a cane, has severe unremitting pain and can ambulate only 2 blocks before resting.  Patient desires elective right total hip arthroplasty to decrease pain and increase function. The risks, benefits, and alternatives were discussed at length including but not limited to the risks of infection, bleeding, nerve injury, stiffness, blood clots, the need for revision surgery, cardiopulmonary complications, among others, and they were willing to proceed.Benefits have been discussed. Questions answered.     PROCEDURE IN DETAIL: The  patient was identified by armband,  received preoperative IV antibiotics in the holding area at [facility], taken to the operating room , appropriate anesthetic monitors  were attached and general endotracheal anesthesia induced. Foley catheter was inserted. Patient was rolled into the R lateral decubitus position and fixed there with a Stulberg Mark II pelvic clamp and the L lower extremity was then prepped and draped  in the usual sterile fashion from the ankle to the hemipelvis. A time-out  procedure was performed. The skin along the lateral hip and thigh  infiltrated with 10 mL of 0.5% Marcaine and epinephrine solution. We  then made a posterolateral approach to the hip. With a #10 blade, 14 cm  incision through skin and subcutaneous tissue down to the level of the  IT band. Small bleeders were identified and cauterized. IT band cut in  line with skin incision exposing the greater trochanter. A Cobra retractor was placed between the gluteus minimus and the superior hip  joint capsule, and a spiked Cobra between the quadratus femoris and the inferior hip joint capsule. This isolated the short  external rotators and piriformis tendons. These were tagged with a #2 Ethibond  suture and cut off their insertion on the intertrochanteric crest. The posterior  capsule was then developed into an acetabular-based flap from Posterior Superior off of the acetabulum out over the femoral neck and back posterior inferior to the acetabular rim. This flap was tagged with two #2 Ethibond sutures and retracted protecting the sciatic nerve. This exposed the arthritic femoral head and osteophytes. The hip was then flexed and internally rotated, dislocating the femoral head and a standard neck cut performed 1 fingerbreadth above the lesser trochanter.  A spiked Cobra was placed in the cotyloid notch and a Hohmann retractor was then used to lever the femur anteriorly off of the anterior pelvic column. A  posterior-inferior wing retractor was placed at the junction of the acetabulum and the ischium completing the acetabular exposure.We then removed the peripheral osteophytes and labrum from the acetabulum. We then reamed the acetabulum up to 47 mm with basket reamers obtaining good coverage in all quadrants, irrigated out with normal  saline solution and hammered into place a 48 mm pinnacle cup in 45  degrees of abduction and about 20 degrees of anteversion. More  peripheral osteophytes removed and a trial 10-degree liner placed with the  index superior-posterior. The hip was then flexed and internally rotated exposing the  proximal femur, which was entered with the initiating reamer followed by  the tapered reamers up to a 3 tapered reamer and broaching up to a #3 broach, which had good fit and fill. A trial reduction was then  performed with a +0  36-mm ball on the standard neck and  excellent stability was noted with at 90 of flexion with 70 of  internal rotation and then full extension withexternal rotation. The hip  could not be dislocated in full extension. The knee could easily flex  to about 140 degrees. We also stretched the abductors at this point,  because of the preexisting adductor contractures. All trial components  were then removed. The acetabulum was irrigated out with normal saline  solution. A titanium Apex [facility] was then screwed into place  followed by a 10-degree polyethylene liner index superior-posterior. On  the femoral side, we then sized for a #3 cement restrictor and irrigated  out the femoral canal with normal saline solution and dried with suction  and sponges. The cement restrictor was placed to the appropriate depth. A  double batch of DePuy 1 cement with 1500 mg of Zinacef was mixed and  injected into the canal under pressure followed by #3 Summit basic stem  in 20 degrees of anteversion and as this was held in place, excess cement  was removed. At  this point, a +0 32-mm ceramic head was  hammered on the stem. The hip was reduced. We checked our stability  one more time and found to be excellent. The wound was once again  thoroughly irrigated out with normal saline solution pulse lavage. The  capsular flap and short external rotators were repaired back to the  intertrochanteric crest through drill holes with a #2 Ethibond suture.  The IT band was closed with running 1 Vicryl suture. The subcutaneous  tissue with 0 and 2-0 undyed Vicryl suture and the skin with running  interlocking 3-0 nylon suture. Dressing of Xeroform and Mepilex was  then applied. The patient was  then unclamped, rolled supine, awaken extubated and taken to recovery room without difficulty in stable condition.   Virgen Belland J 07/01/2014, 11:45 AM

## 2014-07-01 NOTE — Anesthesia Procedure Notes (Signed)
Spinal Patient location during procedure: OR Start time: 07/01/2014 10:25 AM End time: 07/01/2014 10:30 AM Staffing Anesthesiologist: Gaynelle AduFITZGERALD, Makaley Storts E Performed by: anesthesiologist  Preanesthetic Checklist Completed: patient identified, surgical consent, pre-op evaluation, timeout performed, IV checked, risks and benefits discussed and monitors and equipment checked Spinal Block Patient position: sitting Prep: Betadine Patient monitoring: cardiac monitor, continuous pulse ox and blood pressure Approach: midline Location: L3-4 Injection technique: single-shot Needle Needle type: Pencan  Needle gauge: 24 G Needle length: 9 cm Assessment Sensory level: T10 Additional Notes Functioning IV was confirmed and monitors were applied. Sterile prep and drape, including hand hygiene and sterile gloves were used. The patient was positioned and the spine was prepped. The skin was anesthetized with lidocaine.  Free flow of clear CSF was obtained prior to injecting local anesthetic into the CSF.  The spinal needle aspirated freely following injection.  The needle was carefully withdrawn.  The patient tolerated the procedure well.

## 2014-07-02 ENCOUNTER — Encounter (HOSPITAL_COMMUNITY): Payer: Self-pay | Admitting: Orthopedic Surgery

## 2014-07-02 LAB — BASIC METABOLIC PANEL
Anion gap: 9 (ref 5–15)
BUN: 25 mg/dL — AB (ref 6–23)
CHLORIDE: 109 mmol/L (ref 96–112)
CO2: 19 mmol/L (ref 19–32)
CREATININE: 1.43 mg/dL — AB (ref 0.50–1.10)
Calcium: 9.2 mg/dL (ref 8.4–10.5)
GFR calc non Af Amer: 32 mL/min — ABNORMAL LOW (ref >90)
GFR, EST AFRICAN AMERICAN: 37 mL/min — AB (ref >90)
Glucose, Bld: 149 mg/dL — ABNORMAL HIGH (ref 70–99)
POTASSIUM: 4.9 mmol/L (ref 3.5–5.1)
Sodium: 137 mmol/L (ref 135–145)

## 2014-07-02 LAB — GLUCOSE, CAPILLARY
GLUCOSE-CAPILLARY: 119 mg/dL — AB (ref 70–99)
GLUCOSE-CAPILLARY: 112 mg/dL — AB (ref 70–99)
Glucose-Capillary: 103 mg/dL — ABNORMAL HIGH (ref 70–99)
Glucose-Capillary: 139 mg/dL — ABNORMAL HIGH (ref 70–99)
Glucose-Capillary: 124 mg/dL — ABNORMAL HIGH (ref 70–99)

## 2014-07-02 LAB — CBC
HEMATOCRIT: 27 % — AB (ref 36.0–46.0)
HEMOGLOBIN: 8.7 g/dL — AB (ref 12.0–15.0)
MCH: 27.4 pg (ref 26.0–34.0)
MCHC: 32.2 g/dL (ref 30.0–36.0)
MCV: 84.9 fL (ref 78.0–100.0)
Platelets: 230 10*3/uL (ref 150–400)
RBC: 3.18 MIL/uL — ABNORMAL LOW (ref 3.87–5.11)
RDW: 15.5 % (ref 11.5–15.5)
WBC: 10.3 10*3/uL (ref 4.0–10.5)

## 2014-07-02 NOTE — Clinical Social Work Psychosocial (Signed)
Clinical Social Work Department BRIEF PSYCHOSOCIAL ASSESSMENT 07/02/2014  Patient:  Haley Campbell, Haley Campbell     Account Number:  [id number]     Admit date:  07/01/2014  Clinical Social Worker:  Durward Fortes, CLINICAL SOCIAL WORKER  Date/Time:  07/02/2014 02:03 PM  Referred by:  Physician  Date Referred:  07/02/2014 Referred for  SNF Placement   Other Referral:   none.   Interview type:  Patient Other interview type:   none.    PSYCHOSOCIAL DATA Living Status:  ALONE Admitted from facility:   Level of care:   Primary support name:  Clyda Greener Primary support relationship to patient:  CHILD, ADULT Degree of support available:   Adequate support.    CURRENT CONCERNS Current Concerns  Post-Acute Placement   Other Concerns:   none.    SOCIAL WORK ASSESSMENT / PLAN CSW and BSW-Intern consulted regarding SNF placement for pt once medically stable for discharge.    BSW-Intern met with pt and pt's daughter at bedside. Pt informed BSW-Intern that pt had  previously arranged for pt to be discharged to [place] once medically stable for discharge. Pt's daughter informed BSW-Intern that pt's daughter would contact [facility] [place] to further complete paperwork. Pt expressed no stressors at this time and looks foward to returning home once rehab is completed.    CSW and BSW-Intern to continue to assist with discharge planning needs.   Assessment/plan status:  Psychosocial Support/Ongoing Assessment of Needs Other assessment/ plan:   none.   Information/referral to community resources:   Pt to be discharged to [place] once medically stable for discharge.    PATIENT'S/FAMILY'S RESPONSE TO PLAN OF CARE: Pt agreeable to CSW plan of care. Pt expressed no further questions or concerns at this time.       Virgie Dad Wiley, BSW-Intern  CSW reviewed information and agreeable to BSW-Intern documentation.  Lubertha Sayres, Concepcion [phone]) Licensed Clinical  Social Worker Orthopedics [phone]) and Surgical [phone])

## 2014-07-02 NOTE — Evaluation (Signed)
Physical Therapy Evaluation Patient Details Name: Haley Campbell MRN: [id number] DOB: 23-Jul-1923 Today's Date: 07/02/2014   History of Present Illness  81 y.o. female admitted to [facility] on 07/01/14 for elective posterior THA.  Pt with significant PMHx of HTN, tinnitus bil ears, low back pain, gout, anxiety, depression, HA, sciatica, DM, and  R TKA.  Clinical Impression  Pt is POD #1 and is surprised by her post op pain level (high).  She was still able to move a short distance with RW and min assist around the room. Pt is appropriate for SNF level rehab before returning home alone at discharge.  PT to follow acutely for deficits listed below.       Follow Up Recommendations SNF (pt requesting [facility] in [place])    Equipment Recommendations  Rolling walker with 5" wheels;3in1 (PT)    Recommendations for Other Services   NA    Precautions / Restrictions Precautions Precautions: Posterior Hip Precaution Booklet Issued: Yes (comment) Precaution Comments: posterior hip precautions verbally reviewed.  Handout given Restrictions LLE Weight Bearing: Weight bearing as tolerated      Mobility  Bed Mobility Overal bed mobility: Needs Assistance Bed Mobility: Supine to Sit     Supine to sit: Min assist     General bed mobility comments: Min assist to help progress left leg to EOB and support trunk during transition to sitting.  Verbal cues for 1/2 bridge technique and pt using bed rail to pull trunk to sit.   Transfers Overall transfer level: Needs assistance Equipment used: Rolling walker (2 wheeled) Transfers: Sit to/from Stand Sit to Stand: Min assist         General transfer comment: Min assist to support trunk during transition to stand.  Verbal cues for safe hand placement and assist to stabilize RW.   Ambulation/Gait Ambulation/Gait assistance: Min assist Ambulation Distance (Feet): 5 Feet Assistive device: Rolling walker (2 wheeled) Gait Pattern/deviations:  Step-through pattern;Antalgic     General Gait Details: Pt was able to get up to [facility] and then to recliner chair with RW and min assist at trunk for safety and balance.  Verbal cues for safe RW use         Balance Overall balance assessment: Needs assistance Sitting-balance support: Feet supported;No upper extremity supported Sitting balance-Leahy Scale: Good     Standing balance support: No upper extremity supported;Bilateral upper extremity supported;Single extremity supported Standing balance-Leahy Scale: Fair                               Pertinent Vitals/Pain Pain Assessment: 0-10 Pain Score: 10-Worst pain ever Pain Location: left hip Pain Descriptors / Indicators: Aching;Burning Pain Intervention(s): Limited activity within patient's tolerance;Monitored during session;Repositioned    Home Living Family/patient expects to be discharged to:: Skilled nursing facility ([facility] [place]) Living Arrangements: Alone   Type of Home: House Home Access: Stairs to enter Entrance Stairs-Rails: Doctor, general practice of Steps: 3 Home Layout: One level Home Equipment: Grab bars - tub/shower;Bedside commode (tub shower)      Prior Function Level of Independence: Independent         Comments: does not drive due to glaucoma        Extremity/Trunk Assessment   Upper Extremity Assessment: Defer to OT evaluation           Lower Extremity Assessment: LLE deficits/detail   LLE Deficits / Details: left leg with normal post  op pain and weakness.  Ankle 3/5, knee 3-/5, hip 2/5  Cervical / Trunk Assessment: Normal  Communication   Communication: No difficulties  Cognition Arousal/Alertness: Awake/alert Behavior During Therapy: WFL for tasks assessed/performed Overall Cognitive Status: Within Functional Limits for tasks assessed                               Assessment/Plan    PT Assessment Patient needs  continued PT services  PT Diagnosis Difficulty walking;Abnormality of gait;Generalized weakness;Acute pain   PT Problem List Decreased strength;Decreased range of motion;Decreased activity tolerance;Decreased balance;Decreased mobility;Decreased knowledge of use of DME;Decreased knowledge of precautions;Pain  PT Treatment Interventions DME instruction;Gait training;Stair training;Functional mobility training;Therapeutic activities;Therapeutic exercise;Balance training;Neuromuscular re-education;Patient/family education;Modalities   PT Goals (Current goals can be found in the Care Plan section) Acute Rehab PT Goals Patient Stated Goal: to go to rehab first before going home PT Goal Formulation: With patient Time For Goal Achievement: 07/09/14 Potential to Achieve Goals: Good    Frequency 7X/week    End of Session   Activity Tolerance: Patient limited by pain Patient left: in chair;with call bell/phone within reach;with family/visitor present Nurse Communication: Mobility status         Time: 1191-4782 PT Time Calculation (min) (ACUTE ONLY): 19 min   Charges:   PT Evaluation $Initial PT Evaluation Tier I: 1 Procedure PT Treatments $Therapeutic Activity: 8-22 mins        Shoshanah Dapper B. Rosana Farnell, PT, DPT [phone]   07/02/2014, 12:43 PM

## 2014-07-02 NOTE — Progress Notes (Signed)
Patient ID: Haley Campbell, female   DOB: [birth year], 81 y.o.   MRN: [id number] PATIENT ID: Haley Campbell  MRN: [id number]  DOB/AGE:  [birth year] / 81 y.o.  1 Day Post-Op Procedure(s) (LRB): TOTAL HIP ARTHROPLASTY (Left)    PROGRESS NOTE Subjective: Patient is alert, oriented, no Nausea, no Vomiting, yes passing gas, no Bowel Movement. Taking PO well. Denies SOB, Chest or Calf Pain. Using Incentive Spirometer, PAS in place. Ambulate WBAT, has been to the bathroom Patient reports pain as 5 on 0-10 scale  .    Objective: Vital signs in last 24 hours: Filed Vitals:   07/01/14 2000 07/02/14 0000 07/02/14 0353 07/02/14 0535  BP:    105/60  Pulse:    94  Temp:    99.4 F (37.4 C)  TempSrc:    Oral  Resp: 16 4 18 19   Height:      Weight:      SpO2: 100% 100% 100% 91%      Intake/Output from previous day: I/O last 3 completed shifts: In: 1380 [P.O.:480; I.V.:900] Out: 102 [Urine:2; Blood:100]   Intake/Output this shift:     LABORATORY DATA:  Recent Labs  07/01/14 0916  HGB 11.9*  HCT 35.0*  NA 139  K 4.7  GLUCOSE 122*    Examination: Neurologically intact ABD soft Neurovascular intact Sensation intact distally Intact pulses distally Dorsiflexion/Plantar flexion intact Incision: dressing C/D/I No cellulitis present Compartment soft} XR AP&Lat of hip shows well placed\fixed THA  Assessment:   1 Day Post-Op Procedure(s) (LRB): TOTAL HIP ARTHROPLASTY (Left) ADDITIONAL DIAGNOSIS:  Expected Acute Blood Loss Anemia, Diabetes and Hypertension  Plan: PT/OT WBAT, THA  posterior precautions  DVT Prophylaxis: SCDx72 hrs, ASA 325 mg BID x 2 weeks  DISCHARGE PLAN: Skilled Nursing Facility/Rehab, [facility], will ned FL2  DISCHARGE NEEDS: HHPT, HHRN, CPM, Walker and 3-in-1 comode seat

## 2014-07-02 NOTE — Clinical Social Work Placement (Signed)
Clinical Social Work Department CLINICAL SOCIAL WORK PLACEMENT NOTE 07/02/2014  Patient:  Haley GalloUGH,[name]  Account Number:  [id number] Admit date:  07/01/2014  Clinical Social Worker:  Hortencia PilarKIERRA WILEY, CLINICAL SOCIAL WORKER  Date/time:  07/02/2014 02:17 PM  Clinical Social Work is seeking post-discharge placement for this patient at the following level of care:   SKILLED NURSING   (*CSW will update this form in Epic as items are completed)   07/02/2014  Patient/family provided with [facility] of Clinical Social Work's list of facilities offering this level of care within the geographic area requested by the patient (or if unable, by the patient's family).  07/02/2014  Patient/family informed of their freedom to choose among providers that offer the needed level of care, that participate in Medicare, Medicaid or managed care program needed by the patient, have an available bed and are willing to accept the patient.  07/02/2014  Patient/family informed of [facility]' ownership interest in [facility], as well as of the fact that they are under no obligation to receive care at this facility.  PASARR submitted to EDS on 07/02/2014 PASARR number received on 07/02/2014  FL2 transmitted to all facilities in geographic area requested by pt/family on  07/02/2014 FL2 transmitted to all facilities within larger geographic area on   Patient informed that his/her managed care company has contracts with or will negotiate with  certain facilities, including the following:     Patient/family informed of bed offers received:  07/02/2014 [facility] [place] Physician recommends and patient chooses bed at    [facility] [place] [facility]  07/03/2014 Patient to be transferred to facility by Patient's daughter transporting Marcelline Deist(Lakena Sparlin, [place]) Patient and family notified of transfer on 07/03/2014  Marcelline Deist(Marina Boerner, Theresia MajorsLCSWA) Name of family member notified:  Patient and patient's daughter updated at bedside Lily Kocher(Iyanah Demont, LCSWA)  The following physician request were entered in Epic:   Additional Comments:   Kierra S. [address]Wiley, BSW-Intern  Marcelline Deistmily Dysen Edmondson, [place] [phone]) Licensed Clinical Social Worker Orthopedics [phone]([id number]) and Surgical [phone]([id number])

## 2014-07-02 NOTE — Progress Notes (Signed)
07/02/14 PT recommended SNF. Referral made to CSW. CSW working on placement. Will continue to follow until discharge.

## 2014-07-03 DIAGNOSIS — M109 Gout, unspecified: Secondary | ICD-10-CM | POA: Diagnosis not present

## 2014-07-03 DIAGNOSIS — E119 Type 2 diabetes mellitus without complications: Secondary | ICD-10-CM | POA: Diagnosis not present

## 2014-07-03 DIAGNOSIS — M519 Unspecified thoracic, thoracolumbar and lumbosacral intervertebral disc disorder: Secondary | ICD-10-CM | POA: Diagnosis not present

## 2014-07-03 DIAGNOSIS — Z471 Aftercare following joint replacement surgery: Secondary | ICD-10-CM | POA: Diagnosis not present

## 2014-07-03 DIAGNOSIS — D500 Iron deficiency anemia secondary to blood loss (chronic): Secondary | ICD-10-CM | POA: Diagnosis not present

## 2014-07-03 DIAGNOSIS — G4700 Insomnia, unspecified: Secondary | ICD-10-CM | POA: Diagnosis not present

## 2014-07-03 DIAGNOSIS — I10 Essential (primary) hypertension: Secondary | ICD-10-CM | POA: Diagnosis not present

## 2014-07-03 DIAGNOSIS — H6120 Impacted cerumen, unspecified ear: Secondary | ICD-10-CM | POA: Diagnosis not present

## 2014-07-03 DIAGNOSIS — R52 Pain, unspecified: Secondary | ICD-10-CM | POA: Diagnosis not present

## 2014-07-03 DIAGNOSIS — Q782 Osteopetrosis: Secondary | ICD-10-CM | POA: Diagnosis not present

## 2014-07-03 DIAGNOSIS — F329 Major depressive disorder, single episode, unspecified: Secondary | ICD-10-CM | POA: Diagnosis not present

## 2014-07-03 DIAGNOSIS — M179 Osteoarthritis of knee, unspecified: Secondary | ICD-10-CM | POA: Diagnosis not present

## 2014-07-03 DIAGNOSIS — F411 Generalized anxiety disorder: Secondary | ICD-10-CM | POA: Diagnosis not present

## 2014-07-03 DIAGNOSIS — F322 Major depressive disorder, single episode, severe without psychotic features: Secondary | ICD-10-CM | POA: Diagnosis not present

## 2014-07-03 DIAGNOSIS — F419 Anxiety disorder, unspecified: Secondary | ICD-10-CM | POA: Diagnosis not present

## 2014-07-03 DIAGNOSIS — R1013 Epigastric pain: Secondary | ICD-10-CM | POA: Diagnosis not present

## 2014-07-03 DIAGNOSIS — M545 Low back pain: Secondary | ICD-10-CM | POA: Diagnosis not present

## 2014-07-03 DIAGNOSIS — J309 Allergic rhinitis, unspecified: Secondary | ICD-10-CM | POA: Diagnosis not present

## 2014-07-03 DIAGNOSIS — Z96642 Presence of left artificial hip joint: Secondary | ICD-10-CM | POA: Diagnosis not present

## 2014-07-03 LAB — CBC
HEMATOCRIT: 28.4 % — AB (ref 36.0–46.0)
HEMOGLOBIN: 9.4 g/dL — AB (ref 12.0–15.0)
MCH: 28.1 pg (ref 26.0–34.0)
MCHC: 33.1 g/dL (ref 30.0–36.0)
MCV: 84.8 fL (ref 78.0–100.0)
Platelets: 202 10*3/uL (ref 150–400)
RBC: 3.35 MIL/uL — ABNORMAL LOW (ref 3.87–5.11)
RDW: 15.5 % (ref 11.5–15.5)
WBC: 10.8 10*3/uL — ABNORMAL HIGH (ref 4.0–10.5)

## 2014-07-03 LAB — BASIC METABOLIC PANEL
Anion gap: 13 (ref 5–15)
BUN: 23 mg/dL (ref 6–23)
CHLORIDE: 108 mmol/L (ref 96–112)
CO2: 15 mmol/L — ABNORMAL LOW (ref 19–32)
Calcium: 9.5 mg/dL (ref 8.4–10.5)
Creatinine, Ser: 1.44 mg/dL — ABNORMAL HIGH (ref 0.50–1.10)
GFR, EST AFRICAN AMERICAN: 36 mL/min — AB (ref >90)
GFR, EST NON AFRICAN AMERICAN: 31 mL/min — AB (ref >90)
Glucose, Bld: 192 mg/dL — ABNORMAL HIGH (ref 70–99)
Potassium: 4.8 mmol/L (ref 3.5–5.1)
SODIUM: 136 mmol/L (ref 135–145)

## 2014-07-03 LAB — GLUCOSE, CAPILLARY
GLUCOSE-CAPILLARY: 159 mg/dL — AB (ref 70–99)
Glucose-Capillary: 121 mg/dL — ABNORMAL HIGH (ref 70–99)
Glucose-Capillary: 113 mg/dL — ABNORMAL HIGH (ref 70–99)

## 2014-07-03 LAB — HEMOGLOBIN A1C
Hgb A1c MFr Bld: 6.1 % — ABNORMAL HIGH (ref 4.8–5.6)
MEAN PLASMA GLUCOSE: 128 mg/dL

## 2014-07-03 MED ORDER — OXYCODONE HCL 5 MG PO TABS
ORAL_TABLET | ORAL | Status: AC
  Administered 2014-07-03: 03:00:00
  Filled 2014-07-03: qty 10

## 2014-07-03 MED ORDER — METHOCARBAMOL 500 MG PO TABS
ORAL_TABLET | ORAL | Status: AC
  Administered 2014-07-03: 500 mg
  Filled 2014-07-03: qty 1

## 2014-07-03 MED ORDER — ALPRAZOLAM 0.25 MG PO TABS
ORAL_TABLET | ORAL | Status: AC
  Administered 2014-07-03: 01:00:00
  Filled 2014-07-03: qty 1

## 2014-07-03 NOTE — Discharge Summary (Signed)
Patient ID: Haley Campbell MRN: [id number] DOB/AGE: 01-17-1924 81 y.o.  Admit date: 07/01/2014 Discharge date: 07/03/2014  Admission Diagnoses:  Active Problems:   Arthritis, hip   Discharge Diagnoses:  Same  Past Medical History  Diagnosis Date  . Tinnitus of both ears     Chronic severe, and mixed conductive and sensorineural hearing loss  . Hypertension   . Diverticulosis     with episodic diverticulitis  . Low back pain     Chronic  . Hypertension   . Insomnia     severe  . Gout   . Anxiety   . Depression   . HA (headache)     chronic  . Dyspepsia     persistent  . Degenerative disc disease     knees  . Allergic rhinitis   . Otosclerosis     with left stapedectomy  . Sciatica     left lower extremity   . PONV (postoperative nausea and vomiting)   . Diabetes mellitus     type 2 - fasting cbg 100-130  . History of blood transfusion     Surgeries: Procedure(s): TOTAL HIP ARTHROPLASTY on 07/01/2014   Consultants:    Discharged Condition: Improved  Hospital Course: Haley Campbell is an 81 y.o. female who was admitted 07/01/2014 for operative treatment of<principal problem not specified>. Patient has severe unremitting pain that affects sleep, daily activities, and work/hobbies. After pre-op clearance the patient was taken to the operating room on 07/01/2014 and underwent  Procedure(s): TOTAL HIP ARTHROPLASTY.    Patient was given perioperative antibiotics: Anti-infectives    Start     Dose/Rate Route Frequency Ordered Stop   07/01/14 1103  cefUROXime (ZINACEF) injection  Status:  Discontinued       As needed 07/01/14 1103 07/01/14 1240   07/01/14 0600  ceFAZolin (ANCEF) IVPB 2 g/50 mL premix     2 g 100 mL/hr over 30 Minutes Intravenous On call to O.Campbell. 06/30/14 1401 07/01/14 1040       Patient was given sequential compression devices, early ambulation, and chemoprophylaxis to prevent DVT.  Patient benefited maximally from hospital stay and there were no  complications.    Recent vital signs: Patient Vitals for the past 24 hrs:  BP Temp Temp src Pulse Resp SpO2  07/03/14 0541 (!) 124/55 mmHg 98.2 F (36.8 C) Oral 92 17 94 %  07/03/14 0400 - - - - 18 94 %  07/03/14 0000 - - - - 18 95 %  07/02/14 2000 - - - - 16 96 %  07/02/14 1943 (!) 120/48 mmHg 99.3 F (37.4 C) - 99 16 95 %  07/02/14 1325 103/65 mmHg 98.2 F (36.8 C) Oral 94 16 100 %  07/02/14 1045 (!) 103/48 mmHg - - - - -     Recent laboratory studies:  Recent Labs  07/01/14 0916 07/02/14 0647  WBC  --  10.3  HGB 11.9* 8.7*  HCT 35.0* 27.0*  PLT  --  230  NA 139 137  K 4.7 4.9  CL  --  109  CO2  --  19  BUN  --  25*  CREATININE  --  1.43*  GLUCOSE 122* 149*  CALCIUM  --  9.2     Discharge Medications:     Medication List    STOP taking these medications        levofloxacin 500 MG tablet  Commonly known as:  LEVAQUIN     traMADol-acetaminophen 37.5-325 MG per  tablet  Commonly known as:  ULTRACET      TAKE these medications        ALPRAZolam 0.5 MG tablet  Commonly known as:  XANAX  Take 0.5 mg by mouth at bedtime as needed for anxiety.     aspirin EC 325 MG tablet  Take 1 tablet (325 mg total) by mouth 2 (two) times daily.     gabapentin 300 MG capsule  Commonly known as:  NEURONTIN  Take 1 capsule (300 mg total) by mouth 2 (two) times daily.     glucose blood test strip  1 each by Other route as needed for other. Use as instructed     ibuprofen 200 MG tablet  Commonly known as:  ADVIL,MOTRIN  Take 2 tablets (400 mg total) by mouth every 6 (six) hours as needed for fever or mild pain.     latanoprost 0.005 % ophthalmic solution  Commonly known as:  XALATAN  Place 1 drop into the right eye at bedtime.     lisinopril 10 MG tablet  Commonly known as:  PRINIVIL,ZESTRIL  Take 10 mg by mouth daily.     metFORMIN 500 MG tablet  Commonly known as:  GLUCOPHAGE  Take 500 mg by mouth 2 (two) times daily with a meal.     oxyCODONE-acetaminophen  5-325 MG per tablet  Commonly known as:  ROXICET  Take 1 tablet by mouth every 4 (four) hours as needed.     tizanidine 2 MG capsule  Commonly known as:  ZANAFLEX  Take 1 capsule (2 mg total) by mouth 3 (three) times daily.     zolpidem 10 MG tablet  Commonly known as:  AMBIEN  Take 10 mg by mouth at bedtime as needed for sleep.        Diagnostic Studies: Dg Chest 2 View  06/21/2014   CLINICAL DATA:  81 year old female with a preoperative chest x-ray for hip replacement. No current symptoms.  EXAM: CHEST - 2 VIEW  COMPARISON:  Chest x-ray 04/10/2014, CT abdomen 12/04/2004  FINDINGS: Cardiomediastinal silhouette unchanged in size and contour. Atherosclerotic calcifications of the aortic arch. Tortuosity of the descending thoracic aorta.  No evidence of pulmonary vascular congestion.  No confluent airspace disease, pleural effusion, or pneumothorax.  Rounded eggshell calcification within the left upper quadrant, corresponding to rim calcified cyst of the spleen.  Degenerative changes of the hips. No displaced fracture. Degenerative changes of the visualized spine.  IMPRESSION: No radiographic evidence of acute cardiopulmonary disease.  Atherosclerosis  Signed,  Yvone Neu. Loreta Ave, DO  [facility]  [facility]   Electronically Signed   By: Gilmer Mor D.O.   On: 06/21/2014 17:10   Dg Pelvis Portable  07/01/2014   CLINICAL DATA:  History of hip arthroplasty.  EXAM: PORTABLE PELVIS 1-2 VIEWS  COMPARISON:  None  FINDINGS: Single view of the pelvis demonstrates a left hip arthroplasty. No evidence for a periprosthetic fracture. The hip appears located on this single view. Lucency in the left thigh soft tissues are compatible with recent surgery. Pelvic bony ring is intact. Mild sclerosis at the pubic symphysis.  IMPRESSION: Left hip arthroplasty.  No complicating features.   Electronically Signed   By: Richarda Overlie M.D.   On: 07/01/2014 14:10    Disposition:  01-Home or Self Care      Discharge Instructions    Call MD / Call 911    Complete by:  As directed   If you experience chest  pain or shortness of breath, CALL 911 and be transported to the hospital emergency room.  If you develope a fever above 101 F, pus (white drainage) or increased drainage or redness at the wound, or calf pain, call your surgeon's office.     Change dressing    Complete by:  As directed   You may change your dressing on day 5, then change the dressing daily with sterile 4 x 4 inch gauze dressing and paper tape.  You may clean the incision with alcohol prior to redressing     Constipation Prevention    Complete by:  As directed   Drink plenty of fluids.  Prune juice may be helpful.  You may use a stool softener, such as Colace (over the counter) 100 mg twice a day.  Use MiraLax (over the counter) for constipation as needed.     Diet - low sodium heart healthy    Complete by:  As directed      Discharge instructions    Complete by:  As directed   Follow up in office with Dr. Turner Daniels in 2 weeks.     Driving restrictions    Complete by:  As directed   No driving for 2 weeks     Follow the hip precautions as taught in Physical Therapy    Complete by:  As directed      Increase activity slowly as tolerated    Complete by:  As directed      Patient may shower    Complete by:  As directed   You may shower without a dressing once there is no drainage.  Do not wash over the wound.  If drainage remains, cover wound with plastic wrap and then shower.           Follow-up Information    Follow up with Nestor Lewandowsky, MD In 2 weeks.   Specialty:  Orthopedic Surgery   Contact information:   [address] [place] [address] [phone]        Signed: Vear Clock Haley Campbell 07/03/2014, 8:08 AM

## 2014-07-03 NOTE — Progress Notes (Signed)
Physical Therapy Treatment Patient Details Name: Haley Campbell MRN: [id number] DOB: 15-Jan-1924 Today's Date: 07/03/2014    History of Present Illness 81 y.o. female admitted to [facility] on 07/01/14 for elective posterior THA.  Pt with significant PMHx of HTN, tinnitus bil ears, low back pain, gout, anxiety, depression, HA, sciatica, DM, and  R TKA.    PT Comments    Pt is progressing well with her mobility and has reports of less pain today compared to yesterday.  She was able to progress gait further into the hallway and do more of her surgical hip exercises.  PT will continue to follow acutely until d/c to SNF is arranged.   Follow Up Recommendations  SNF     Equipment Recommendations  Rolling walker with 5" wheels;3in1 (PT)    Recommendations for Other Services   NA     Precautions / Restrictions Precautions Precautions: Posterior Hip Precaution Booklet Issued: Yes (comment) Precaution Comments: pt able to report 2/3 hip precautions.  Restrictions LLE Weight Bearing: Weight bearing as tolerated    Mobility  Bed Mobility Overal bed mobility: Needs Assistance Bed Mobility: Supine to Sit     Supine to sit: Min assist     General bed mobility comments: Min assist of left leg to progress it to EOB and then min assist of trunk to get to sitting.  Pt using bed rail and leverage from therapist to pull herself upright.   Transfers Overall transfer level: Needs assistance Equipment used: Rolling walker (2 wheeled) Transfers: Sit to/from Stand Sit to Stand: Min assist;+2 physical assistance         General transfer comment: Two person min assist to go to standing from sitting.  Pt requesting her daughter help as well, so +2 used, but not required to get to standing.   Only one person min assist to stand from [facility]    Ambulation/Gait Ambulation/Gait assistance: Min assist Ambulation Distance (Feet): 55 Feet Assistive device: Rolling walker (2 wheeled) Gait Pattern/deviations:  Step-to pattern;Antalgic Gait velocity: decreased Gait velocity interpretation: Below normal speed for age/gender General Gait Details: Pt with mildly antalgic gait pattern.  Min assist for balance and support during gait. Verbal cues to pick up her feet while turning (don't pivot).        Balance Overall balance assessment: Needs assistance Sitting-balance support: Feet supported;No upper extremity supported Sitting balance-Leahy Scale: Good     Standing balance support: Bilateral upper extremity supported Standing balance-Leahy Scale: Fair                      Cognition Arousal/Alertness: Awake/alert Behavior During Therapy: WFL for tasks assessed/performed Overall Cognitive Status: Within Functional Limits for tasks assessed                      Exercises Total Joint Exercises Ankle Circles/Pumps: AROM;Both;20 reps;Supine Quad Sets: AROM;Left;10 reps;Supine Short Arc Quad: AROM;Left;10 reps;Supine Heel Slides: AAROM;Left;10 reps;Supine Hip ABduction/ADduction: AAROM;Left;10 reps;Supine        Pertinent Vitals/Pain Pain Assessment: 0-10 Pain Score: 5  Pain Location: left hip Pain Descriptors / Indicators: Aching Pain Intervention(s): Limited activity within patient's tolerance;Monitored during session;Repositioned;Premedicated before session           PT Goals (current goals can now be found in the care plan section) Acute Rehab PT Goals Patient Stated Goal: to go to rehab first before going home Progress towards PT goals: Progressing toward goals    Frequency  7X/week  PT Plan Current plan remains appropriate       End of Session   Activity Tolerance: Patient limited by fatigue;Patient limited by pain Patient left: in chair;with call bell/phone within reach;with family/visitor present     Time: 1030-1107 PT Time Calculation (min) (ACUTE ONLY): 37 min  Charges:  $Gait Training: 8-22 mins $Therapeutic Exercise: 8-22 mins                       Keleigh Kazee B. Rogan Wigley, PT, DPT [phone]   07/03/2014, 1:38 PM

## 2014-07-03 NOTE — Progress Notes (Signed)
PATIENT ID: Haley Campbell  MRN: [id number]  DOB/AGE:  1923-08-12 / 81 y.o.  2 Days Post-Op Procedure(s) (LRB): TOTAL HIP ARTHROPLASTY (Left)    PROGRESS NOTE Subjective: Patient is alert, oriented, no Nausea, no Vomiting, yes passing gas, no Bowel Movement. Taking PO Well. Denies SOB, Chest or Calf Pain. Using Incentive Spirometer, PAS in place. Ambulate WBAT Patient reports pain as 6 on 0-10 scale  .    Objective: Vital signs in last 24 hours: Filed Vitals:   07/02/14 2000 07/03/14 0000 07/03/14 0400 07/03/14 0541  BP:    124/55  Pulse:    92  Temp:    98.2 F (36.8 C)  TempSrc:    Oral  Resp: 16 18 18 17   Height:      Weight:      SpO2: 96% 95% 94% 94%      Intake/Output from previous day: I/O last 3 completed shifts: In: 840 [P.O.:840] Out: 302 [Urine:302]   Intake/Output this shift:     LABORATORY DATA:  Recent Labs  07/01/14 0916  07/01/14 1740 07/02/14 0647 07/02/14 1152 07/02/14 1623  WBC  --   --   --  10.3  --   --   HGB 11.9*  --   --  8.7*  --   --   HCT 35.0*  --   --  27.0*  --   --   PLT  --   --   --  230  --   --   NA 139  --   --  137  --   --   K 4.7  --   --  4.9  --   --   CL  --   --   --  109  --   --   CO2  --   --   --  19  --   --   BUN  --   --   --  25*  --   --   CREATININE  --   --   --  1.43*  --   --   GLUCOSE 122*  --   --  149*  --   --   GLUCAP  --   < > 139*  --  124* 112*  CALCIUM  --   --   --  9.2  --   --   < > = values in this interval not displayed.  Examination: Neurologically intact Neurovascular intact Sensation intact distally Intact pulses distally Dorsiflexion/Plantar flexion intact Incision: dressing C/D/I No cellulitis present Compartment soft} XR AP&Lat of hip shows well placed\fixed THA  Assessment:   2 Days Post-Op Procedure(s) (LRB): TOTAL HIP ARTHROPLASTY (Left) ADDITIONAL DIAGNOSIS:  Expected Acute Blood Loss Anemia, Diabetes and Hypertension  Plan: PT/OT WBAT, THA  posterior  precautions  DVT Prophylaxis: SCDx72 hrs, ASA 325 mg BID x 2 weeks  DISCHARGE PLAN: Skilled Nursing Facility/Rehab, when bed available  DISCHARGE NEEDS: HHPT, HHRN, Walker and 3-in-1 comode seat

## 2014-07-03 NOTE — Clinical Social Work Note (Signed)
Patient to be discharged to [facility]. Patient and patient's daughter updated at bedside.  Bundle patient.  Facility: [facility] Report number: [phone] Transportation: Patient's daughter  Lily Kochermily Cherokee Boccio, LCSWA [phone]) Licensed Clinical Social Worker Orthopedics [phone]([id number]) and Surgical [phone]([id number])

## 2014-07-03 NOTE — Progress Notes (Signed)
Report called to [facility]. 

## 2014-07-08 DIAGNOSIS — D500 Iron deficiency anemia secondary to blood loss (chronic): Secondary | ICD-10-CM | POA: Diagnosis not present

## 2014-07-08 DIAGNOSIS — H6120 Impacted cerumen, unspecified ear: Secondary | ICD-10-CM | POA: Diagnosis not present

## 2014-07-08 DIAGNOSIS — M179 Osteoarthritis of knee, unspecified: Secondary | ICD-10-CM | POA: Diagnosis not present

## 2014-07-12 DIAGNOSIS — Z471 Aftercare following joint replacement surgery: Secondary | ICD-10-CM | POA: Diagnosis not present

## 2014-07-12 DIAGNOSIS — F322 Major depressive disorder, single episode, severe without psychotic features: Secondary | ICD-10-CM | POA: Diagnosis not present

## 2014-07-12 DIAGNOSIS — E119 Type 2 diabetes mellitus without complications: Secondary | ICD-10-CM | POA: Diagnosis not present

## 2014-07-12 DIAGNOSIS — I10 Essential (primary) hypertension: Secondary | ICD-10-CM | POA: Diagnosis not present

## 2014-07-13 DIAGNOSIS — F329 Major depressive disorder, single episode, unspecified: Secondary | ICD-10-CM | POA: Diagnosis not present

## 2014-07-13 DIAGNOSIS — E119 Type 2 diabetes mellitus without complications: Secondary | ICD-10-CM | POA: Diagnosis not present

## 2014-07-13 DIAGNOSIS — I10 Essential (primary) hypertension: Secondary | ICD-10-CM | POA: Diagnosis not present

## 2014-07-13 DIAGNOSIS — Z96642 Presence of left artificial hip joint: Secondary | ICD-10-CM | POA: Diagnosis not present

## 2014-07-13 DIAGNOSIS — Z471 Aftercare following joint replacement surgery: Secondary | ICD-10-CM | POA: Diagnosis not present

## 2014-07-13 DIAGNOSIS — F419 Anxiety disorder, unspecified: Secondary | ICD-10-CM | POA: Diagnosis not present

## 2014-07-15 DIAGNOSIS — F419 Anxiety disorder, unspecified: Secondary | ICD-10-CM | POA: Diagnosis not present

## 2014-07-15 DIAGNOSIS — F329 Major depressive disorder, single episode, unspecified: Secondary | ICD-10-CM | POA: Diagnosis not present

## 2014-07-15 DIAGNOSIS — Z471 Aftercare following joint replacement surgery: Secondary | ICD-10-CM | POA: Diagnosis not present

## 2014-07-15 DIAGNOSIS — E119 Type 2 diabetes mellitus without complications: Secondary | ICD-10-CM | POA: Diagnosis not present

## 2014-07-15 DIAGNOSIS — Z96642 Presence of left artificial hip joint: Secondary | ICD-10-CM | POA: Diagnosis not present

## 2014-07-15 DIAGNOSIS — I10 Essential (primary) hypertension: Secondary | ICD-10-CM | POA: Diagnosis not present

## 2014-07-16 DIAGNOSIS — E119 Type 2 diabetes mellitus without complications: Secondary | ICD-10-CM | POA: Diagnosis not present

## 2014-07-16 DIAGNOSIS — M1612 Unilateral primary osteoarthritis, left hip: Secondary | ICD-10-CM | POA: Diagnosis not present

## 2014-07-16 DIAGNOSIS — F329 Major depressive disorder, single episode, unspecified: Secondary | ICD-10-CM | POA: Diagnosis not present

## 2014-07-16 DIAGNOSIS — Z471 Aftercare following joint replacement surgery: Secondary | ICD-10-CM | POA: Diagnosis not present

## 2014-07-16 DIAGNOSIS — Z96642 Presence of left artificial hip joint: Secondary | ICD-10-CM | POA: Diagnosis not present

## 2014-07-16 DIAGNOSIS — I10 Essential (primary) hypertension: Secondary | ICD-10-CM | POA: Diagnosis not present

## 2014-07-16 DIAGNOSIS — F419 Anxiety disorder, unspecified: Secondary | ICD-10-CM | POA: Diagnosis not present

## 2014-07-17 DIAGNOSIS — F329 Major depressive disorder, single episode, unspecified: Secondary | ICD-10-CM | POA: Diagnosis not present

## 2014-07-17 DIAGNOSIS — Z96642 Presence of left artificial hip joint: Secondary | ICD-10-CM | POA: Diagnosis not present

## 2014-07-17 DIAGNOSIS — F419 Anxiety disorder, unspecified: Secondary | ICD-10-CM | POA: Diagnosis not present

## 2014-07-17 DIAGNOSIS — Z471 Aftercare following joint replacement surgery: Secondary | ICD-10-CM | POA: Diagnosis not present

## 2014-07-17 DIAGNOSIS — E119 Type 2 diabetes mellitus without complications: Secondary | ICD-10-CM | POA: Diagnosis not present

## 2014-07-17 DIAGNOSIS — I10 Essential (primary) hypertension: Secondary | ICD-10-CM | POA: Diagnosis not present

## 2014-07-18 DIAGNOSIS — F329 Major depressive disorder, single episode, unspecified: Secondary | ICD-10-CM | POA: Diagnosis not present

## 2014-07-18 DIAGNOSIS — F419 Anxiety disorder, unspecified: Secondary | ICD-10-CM | POA: Diagnosis not present

## 2014-07-18 DIAGNOSIS — Z471 Aftercare following joint replacement surgery: Secondary | ICD-10-CM | POA: Diagnosis not present

## 2014-07-18 DIAGNOSIS — E119 Type 2 diabetes mellitus without complications: Secondary | ICD-10-CM | POA: Diagnosis not present

## 2014-07-18 DIAGNOSIS — I10 Essential (primary) hypertension: Secondary | ICD-10-CM | POA: Diagnosis not present

## 2014-07-18 DIAGNOSIS — Z96642 Presence of left artificial hip joint: Secondary | ICD-10-CM | POA: Diagnosis not present

## 2014-07-19 DIAGNOSIS — Z471 Aftercare following joint replacement surgery: Secondary | ICD-10-CM | POA: Diagnosis not present

## 2014-07-19 DIAGNOSIS — F329 Major depressive disorder, single episode, unspecified: Secondary | ICD-10-CM | POA: Diagnosis not present

## 2014-07-19 DIAGNOSIS — F419 Anxiety disorder, unspecified: Secondary | ICD-10-CM | POA: Diagnosis not present

## 2014-07-19 DIAGNOSIS — I10 Essential (primary) hypertension: Secondary | ICD-10-CM | POA: Diagnosis not present

## 2014-07-19 DIAGNOSIS — Z96642 Presence of left artificial hip joint: Secondary | ICD-10-CM | POA: Diagnosis not present

## 2014-07-19 DIAGNOSIS — E119 Type 2 diabetes mellitus without complications: Secondary | ICD-10-CM | POA: Diagnosis not present

## 2014-07-22 DIAGNOSIS — F419 Anxiety disorder, unspecified: Secondary | ICD-10-CM | POA: Diagnosis not present

## 2014-07-22 DIAGNOSIS — Z96642 Presence of left artificial hip joint: Secondary | ICD-10-CM | POA: Diagnosis not present

## 2014-07-22 DIAGNOSIS — Z471 Aftercare following joint replacement surgery: Secondary | ICD-10-CM | POA: Diagnosis not present

## 2014-07-22 DIAGNOSIS — E119 Type 2 diabetes mellitus without complications: Secondary | ICD-10-CM | POA: Diagnosis not present

## 2014-07-22 DIAGNOSIS — F329 Major depressive disorder, single episode, unspecified: Secondary | ICD-10-CM | POA: Diagnosis not present

## 2014-07-22 DIAGNOSIS — I10 Essential (primary) hypertension: Secondary | ICD-10-CM | POA: Diagnosis not present

## 2014-07-23 DIAGNOSIS — I10 Essential (primary) hypertension: Secondary | ICD-10-CM | POA: Diagnosis not present

## 2014-07-23 DIAGNOSIS — E119 Type 2 diabetes mellitus without complications: Secondary | ICD-10-CM | POA: Diagnosis not present

## 2014-07-23 DIAGNOSIS — G4700 Insomnia, unspecified: Secondary | ICD-10-CM | POA: Diagnosis not present

## 2014-07-23 DIAGNOSIS — F419 Anxiety disorder, unspecified: Secondary | ICD-10-CM | POA: Diagnosis not present

## 2014-07-23 DIAGNOSIS — Z471 Aftercare following joint replacement surgery: Secondary | ICD-10-CM | POA: Diagnosis not present

## 2014-07-23 DIAGNOSIS — F322 Major depressive disorder, single episode, severe without psychotic features: Secondary | ICD-10-CM | POA: Diagnosis not present

## 2014-07-23 DIAGNOSIS — M25552 Pain in left hip: Secondary | ICD-10-CM | POA: Diagnosis not present

## 2014-07-23 DIAGNOSIS — M179 Osteoarthritis of knee, unspecified: Secondary | ICD-10-CM | POA: Diagnosis not present

## 2014-07-24 DIAGNOSIS — Z96642 Presence of left artificial hip joint: Secondary | ICD-10-CM | POA: Diagnosis not present

## 2014-07-24 DIAGNOSIS — Z471 Aftercare following joint replacement surgery: Secondary | ICD-10-CM | POA: Diagnosis not present

## 2014-07-24 DIAGNOSIS — I10 Essential (primary) hypertension: Secondary | ICD-10-CM | POA: Diagnosis not present

## 2014-07-24 DIAGNOSIS — E119 Type 2 diabetes mellitus without complications: Secondary | ICD-10-CM | POA: Diagnosis not present

## 2014-07-24 DIAGNOSIS — F419 Anxiety disorder, unspecified: Secondary | ICD-10-CM | POA: Diagnosis not present

## 2014-07-24 DIAGNOSIS — F329 Major depressive disorder, single episode, unspecified: Secondary | ICD-10-CM | POA: Diagnosis not present

## 2014-07-25 DIAGNOSIS — I10 Essential (primary) hypertension: Secondary | ICD-10-CM | POA: Diagnosis not present

## 2014-07-25 DIAGNOSIS — F419 Anxiety disorder, unspecified: Secondary | ICD-10-CM | POA: Diagnosis not present

## 2014-07-25 DIAGNOSIS — F329 Major depressive disorder, single episode, unspecified: Secondary | ICD-10-CM | POA: Diagnosis not present

## 2014-07-25 DIAGNOSIS — Z471 Aftercare following joint replacement surgery: Secondary | ICD-10-CM | POA: Diagnosis not present

## 2014-07-25 DIAGNOSIS — E119 Type 2 diabetes mellitus without complications: Secondary | ICD-10-CM | POA: Diagnosis not present

## 2014-07-25 DIAGNOSIS — Z96642 Presence of left artificial hip joint: Secondary | ICD-10-CM | POA: Diagnosis not present

## 2014-07-26 DIAGNOSIS — Z96642 Presence of left artificial hip joint: Secondary | ICD-10-CM | POA: Diagnosis not present

## 2014-07-26 DIAGNOSIS — I10 Essential (primary) hypertension: Secondary | ICD-10-CM | POA: Diagnosis not present

## 2014-07-26 DIAGNOSIS — F329 Major depressive disorder, single episode, unspecified: Secondary | ICD-10-CM | POA: Diagnosis not present

## 2014-07-26 DIAGNOSIS — Z471 Aftercare following joint replacement surgery: Secondary | ICD-10-CM | POA: Diagnosis not present

## 2014-07-26 DIAGNOSIS — F419 Anxiety disorder, unspecified: Secondary | ICD-10-CM | POA: Diagnosis not present

## 2014-07-26 DIAGNOSIS — E119 Type 2 diabetes mellitus without complications: Secondary | ICD-10-CM | POA: Diagnosis not present

## 2014-08-13 DIAGNOSIS — M1612 Unilateral primary osteoarthritis, left hip: Secondary | ICD-10-CM | POA: Diagnosis not present

## 2014-09-24 DIAGNOSIS — M1612 Unilateral primary osteoarthritis, left hip: Secondary | ICD-10-CM | POA: Diagnosis not present

## 2014-11-08 DIAGNOSIS — M109 Gout, unspecified: Secondary | ICD-10-CM | POA: Diagnosis not present

## 2014-11-08 DIAGNOSIS — I10 Essential (primary) hypertension: Secondary | ICD-10-CM | POA: Diagnosis not present

## 2014-11-08 DIAGNOSIS — G4700 Insomnia, unspecified: Secondary | ICD-10-CM | POA: Diagnosis not present

## 2014-11-08 DIAGNOSIS — F322 Major depressive disorder, single episode, severe without psychotic features: Secondary | ICD-10-CM | POA: Diagnosis not present

## 2014-11-08 DIAGNOSIS — M179 Osteoarthritis of knee, unspecified: Secondary | ICD-10-CM | POA: Diagnosis not present

## 2014-11-08 DIAGNOSIS — E559 Vitamin D deficiency, unspecified: Secondary | ICD-10-CM | POA: Diagnosis not present

## 2014-11-08 DIAGNOSIS — F419 Anxiety disorder, unspecified: Secondary | ICD-10-CM | POA: Diagnosis not present

## 2014-11-08 DIAGNOSIS — Z471 Aftercare following joint replacement surgery: Secondary | ICD-10-CM | POA: Diagnosis not present

## 2014-11-08 DIAGNOSIS — Z1389 Encounter for screening for other disorder: Secondary | ICD-10-CM | POA: Diagnosis not present

## 2014-11-08 DIAGNOSIS — E119 Type 2 diabetes mellitus without complications: Secondary | ICD-10-CM | POA: Diagnosis not present

## 2014-11-08 DIAGNOSIS — Z0001 Encounter for general adult medical examination with abnormal findings: Secondary | ICD-10-CM | POA: Diagnosis not present

## 2014-11-12 DIAGNOSIS — H43819 Vitreous degeneration, unspecified eye: Secondary | ICD-10-CM | POA: Diagnosis not present

## 2014-11-12 DIAGNOSIS — H4011X3 Primary open-angle glaucoma, severe stage: Secondary | ICD-10-CM | POA: Diagnosis not present

## 2014-12-26 DIAGNOSIS — H9011 Conductive hearing loss, unilateral, right ear, with unrestricted hearing on the contralateral side: Secondary | ICD-10-CM | POA: Diagnosis not present

## 2014-12-26 DIAGNOSIS — H9311 Tinnitus, right ear: Secondary | ICD-10-CM | POA: Diagnosis not present

## 2014-12-26 DIAGNOSIS — H903 Sensorineural hearing loss, bilateral: Secondary | ICD-10-CM | POA: Diagnosis not present

## 2014-12-26 DIAGNOSIS — Z79899 Other long term (current) drug therapy: Secondary | ICD-10-CM | POA: Diagnosis not present

## 2014-12-26 DIAGNOSIS — H6121 Impacted cerumen, right ear: Secondary | ICD-10-CM | POA: Diagnosis not present

## 2015-02-24 DIAGNOSIS — Z23 Encounter for immunization: Secondary | ICD-10-CM | POA: Diagnosis not present

## 2015-05-19 DIAGNOSIS — M79671 Pain in right foot: Secondary | ICD-10-CM | POA: Diagnosis not present

## 2015-05-26 DIAGNOSIS — M79671 Pain in right foot: Secondary | ICD-10-CM | POA: Diagnosis not present

## 2015-05-26 DIAGNOSIS — M1612 Unilateral primary osteoarthritis, left hip: Secondary | ICD-10-CM | POA: Diagnosis not present

## 2015-05-26 DIAGNOSIS — M25474 Effusion, right foot: Secondary | ICD-10-CM | POA: Diagnosis not present

## 2015-05-26 DIAGNOSIS — M109 Gout, unspecified: Secondary | ICD-10-CM | POA: Diagnosis not present

## 2015-06-05 DIAGNOSIS — E119 Type 2 diabetes mellitus without complications: Secondary | ICD-10-CM | POA: Diagnosis not present

## 2015-06-05 DIAGNOSIS — F322 Major depressive disorder, single episode, severe without psychotic features: Secondary | ICD-10-CM | POA: Diagnosis not present

## 2015-06-05 DIAGNOSIS — E559 Vitamin D deficiency, unspecified: Secondary | ICD-10-CM | POA: Diagnosis not present

## 2015-06-05 DIAGNOSIS — Z7984 Long term (current) use of oral hypoglycemic drugs: Secondary | ICD-10-CM | POA: Diagnosis not present

## 2015-06-05 DIAGNOSIS — F419 Anxiety disorder, unspecified: Secondary | ICD-10-CM | POA: Diagnosis not present

## 2015-06-05 DIAGNOSIS — M109 Gout, unspecified: Secondary | ICD-10-CM | POA: Diagnosis not present

## 2015-06-05 DIAGNOSIS — M79671 Pain in right foot: Secondary | ICD-10-CM | POA: Diagnosis not present

## 2015-06-05 DIAGNOSIS — G4700 Insomnia, unspecified: Secondary | ICD-10-CM | POA: Diagnosis not present

## 2015-06-05 DIAGNOSIS — M179 Osteoarthritis of knee, unspecified: Secondary | ICD-10-CM | POA: Diagnosis not present

## 2015-06-05 DIAGNOSIS — I10 Essential (primary) hypertension: Secondary | ICD-10-CM | POA: Diagnosis not present

## 2015-07-17 DIAGNOSIS — H401133 Primary open-angle glaucoma, bilateral, severe stage: Secondary | ICD-10-CM | POA: Diagnosis not present

## 2015-07-17 DIAGNOSIS — H353134 Nonexudative age-related macular degeneration, bilateral, advanced atrophic with subfoveal involvement: Secondary | ICD-10-CM | POA: Diagnosis not present

## 2015-07-17 DIAGNOSIS — H43813 Vitreous degeneration, bilateral: Secondary | ICD-10-CM | POA: Diagnosis not present

## 2015-09-10 ENCOUNTER — Other Ambulatory Visit: Payer: Self-pay | Admitting: Internal Medicine

## 2015-09-10 ENCOUNTER — Ambulatory Visit
Admission: RE | Admit: 2015-09-10 | Discharge: 2015-09-10 | Disposition: A | Discharge status: Home / Self Care | Payer: Medicare Other | Source: Ambulatory Visit | Attending: Internal Medicine | Admitting: Internal Medicine

## 2015-09-10 DIAGNOSIS — M109 Gout, unspecified: Secondary | ICD-10-CM | POA: Diagnosis not present

## 2015-09-10 DIAGNOSIS — M79671 Pain in right foot: Secondary | ICD-10-CM

## 2015-09-10 DIAGNOSIS — S99921A Unspecified injury of right foot, initial encounter: Secondary | ICD-10-CM | POA: Diagnosis not present

## 2015-11-18 DIAGNOSIS — E669 Obesity, unspecified: Secondary | ICD-10-CM | POA: Diagnosis not present

## 2015-11-18 DIAGNOSIS — Z1389 Encounter for screening for other disorder: Secondary | ICD-10-CM | POA: Diagnosis not present

## 2015-11-18 DIAGNOSIS — Z0000 Encounter for general adult medical examination without abnormal findings: Secondary | ICD-10-CM | POA: Diagnosis not present

## 2015-11-18 DIAGNOSIS — Z79899 Other long term (current) drug therapy: Secondary | ICD-10-CM | POA: Diagnosis not present

## 2015-11-18 DIAGNOSIS — I10 Essential (primary) hypertension: Secondary | ICD-10-CM | POA: Diagnosis not present

## 2015-11-18 DIAGNOSIS — F322 Major depressive disorder, single episode, severe without psychotic features: Secondary | ICD-10-CM | POA: Diagnosis not present

## 2015-11-18 DIAGNOSIS — Z6827 Body mass index (BMI) 27.0-27.9, adult: Secondary | ICD-10-CM | POA: Diagnosis not present

## 2015-11-18 DIAGNOSIS — E119 Type 2 diabetes mellitus without complications: Secondary | ICD-10-CM | POA: Diagnosis not present

## 2015-11-18 DIAGNOSIS — M79671 Pain in right foot: Secondary | ICD-10-CM | POA: Diagnosis not present

## 2015-11-18 DIAGNOSIS — E559 Vitamin D deficiency, unspecified: Secondary | ICD-10-CM | POA: Diagnosis not present

## 2015-11-18 DIAGNOSIS — M179 Osteoarthritis of knee, unspecified: Secondary | ICD-10-CM | POA: Diagnosis not present

## 2015-11-18 DIAGNOSIS — F419 Anxiety disorder, unspecified: Secondary | ICD-10-CM | POA: Diagnosis not present

## 2015-11-18 DIAGNOSIS — G4700 Insomnia, unspecified: Secondary | ICD-10-CM | POA: Diagnosis not present

## 2016-02-23 DIAGNOSIS — Z23 Encounter for immunization: Secondary | ICD-10-CM | POA: Diagnosis not present

## 2016-04-01 DIAGNOSIS — M7661 Achilles tendinitis, right leg: Secondary | ICD-10-CM | POA: Diagnosis not present

## 2016-04-12 ENCOUNTER — Ambulatory Visit: Payer: Medicare Other | Admitting: Podiatry

## 2016-05-07 DIAGNOSIS — H401133 Primary open-angle glaucoma, bilateral, severe stage: Secondary | ICD-10-CM | POA: Diagnosis not present

## 2016-05-07 DIAGNOSIS — H43813 Vitreous degeneration, bilateral: Secondary | ICD-10-CM | POA: Diagnosis not present

## 2016-05-07 DIAGNOSIS — H353134 Nonexudative age-related macular degeneration, bilateral, advanced atrophic with subfoveal involvement: Secondary | ICD-10-CM | POA: Diagnosis not present

## 2016-05-13 DIAGNOSIS — M7662 Achilles tendinitis, left leg: Secondary | ICD-10-CM | POA: Diagnosis not present

## 2016-06-01 DIAGNOSIS — F419 Anxiety disorder, unspecified: Secondary | ICD-10-CM | POA: Diagnosis not present

## 2016-06-01 DIAGNOSIS — I10 Essential (primary) hypertension: Secondary | ICD-10-CM | POA: Diagnosis not present

## 2016-06-01 DIAGNOSIS — F322 Major depressive disorder, single episode, severe without psychotic features: Secondary | ICD-10-CM | POA: Diagnosis not present

## 2016-06-01 DIAGNOSIS — M79671 Pain in right foot: Secondary | ICD-10-CM | POA: Diagnosis not present

## 2016-06-01 DIAGNOSIS — M179 Osteoarthritis of knee, unspecified: Secondary | ICD-10-CM | POA: Diagnosis not present

## 2016-06-01 DIAGNOSIS — G4700 Insomnia, unspecified: Secondary | ICD-10-CM | POA: Diagnosis not present

## 2016-06-01 DIAGNOSIS — M7662 Achilles tendinitis, left leg: Secondary | ICD-10-CM | POA: Diagnosis not present

## 2016-06-01 DIAGNOSIS — M109 Gout, unspecified: Secondary | ICD-10-CM | POA: Diagnosis not present

## 2016-06-01 DIAGNOSIS — E119 Type 2 diabetes mellitus without complications: Secondary | ICD-10-CM | POA: Diagnosis not present

## 2016-06-11 IMAGING — CR DG CERVICAL SPINE 2 OR 3 VIEWS
5 series · 5 of 5 positions shown · non-contrast
Comparison: 12/04/2004.

CLINICAL DATA: Headaches.

EXAM:
CERVICAL SPINE - 2-3 VIEW

[view not recorded (1 of 5)]
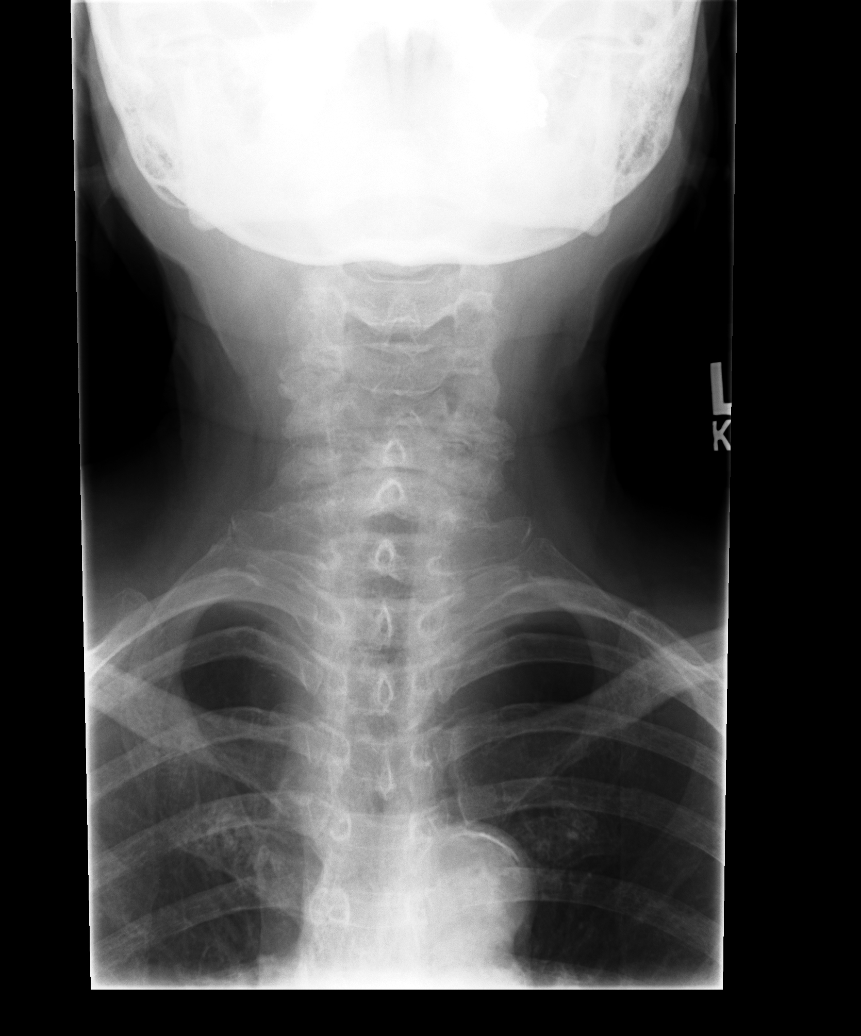

[view not recorded (2 of 5)]
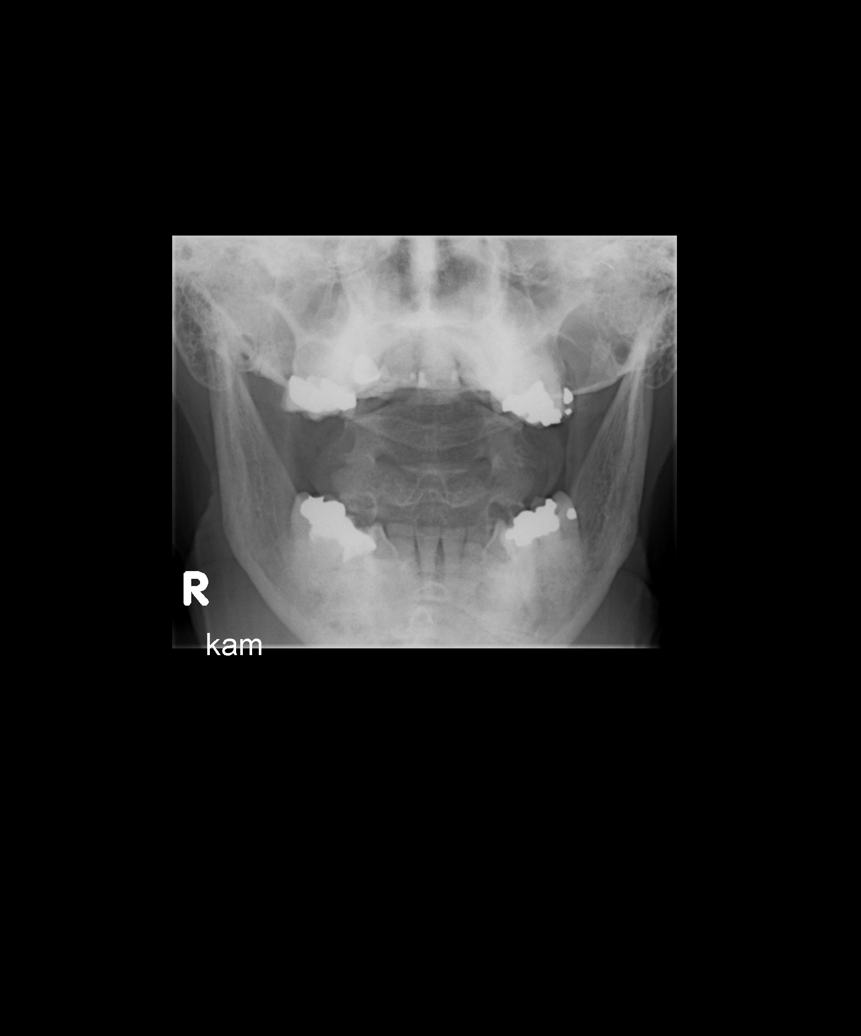

[view not recorded (3 of 5)]
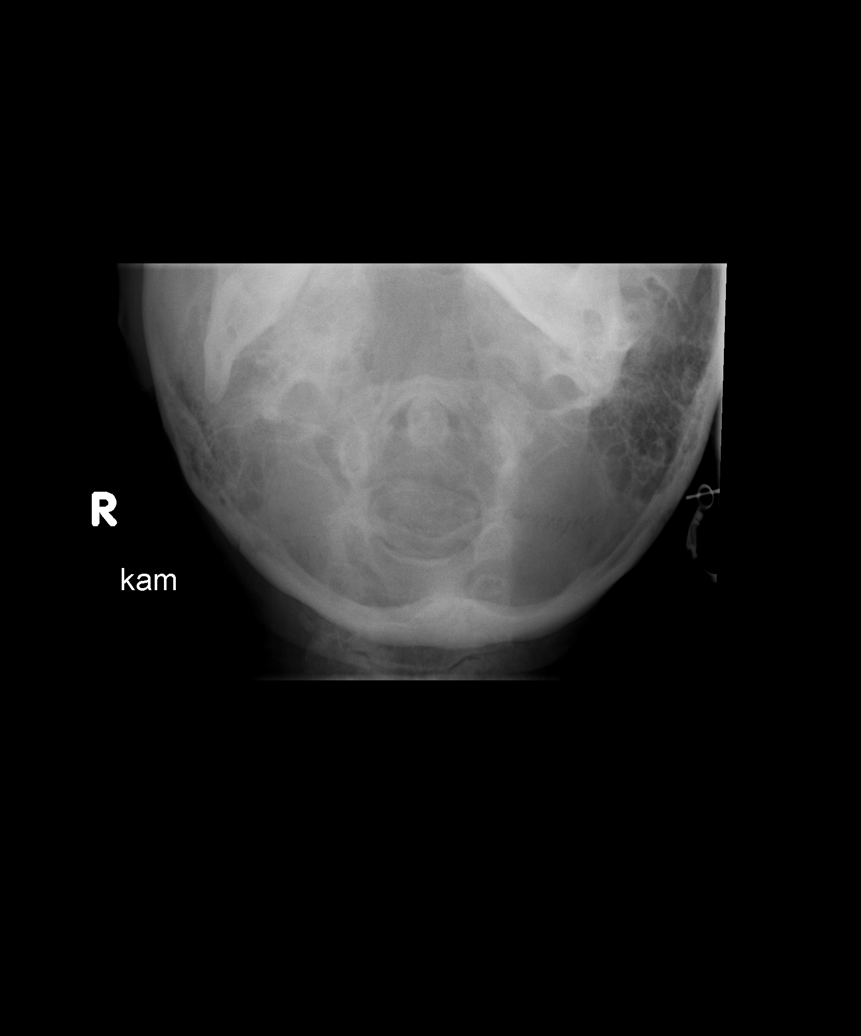

[view not recorded (4 of 5)]
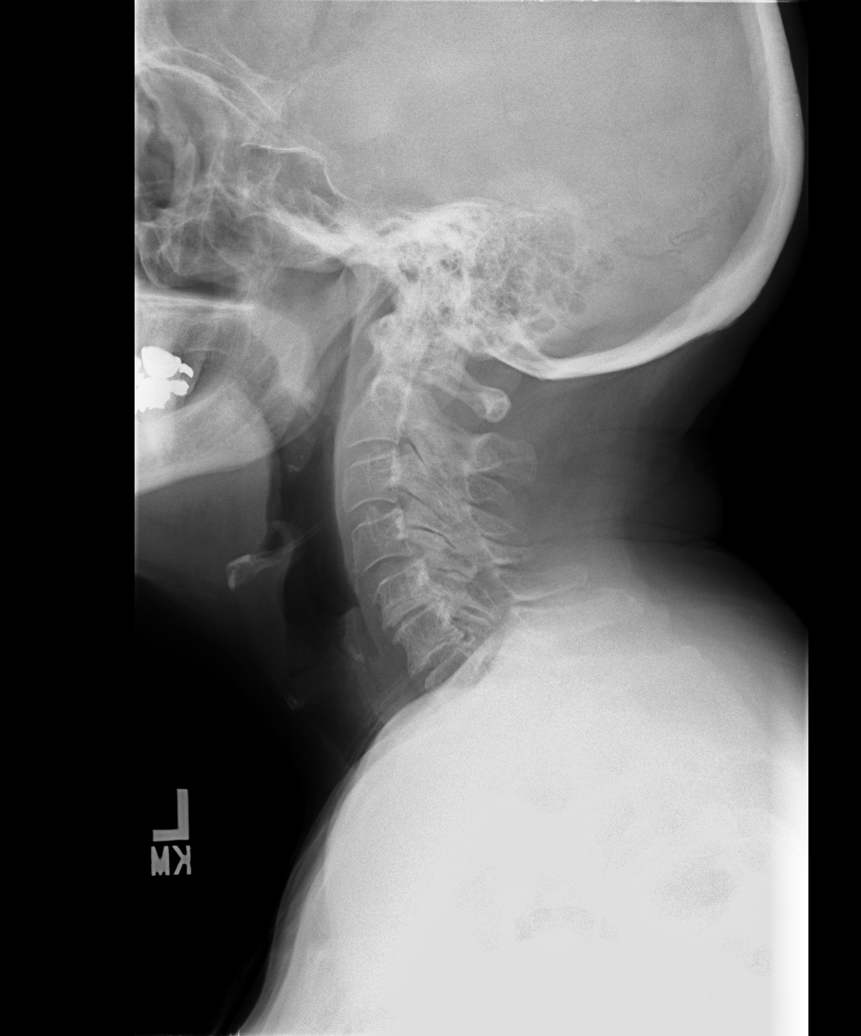

[view not recorded (5 of 5)]
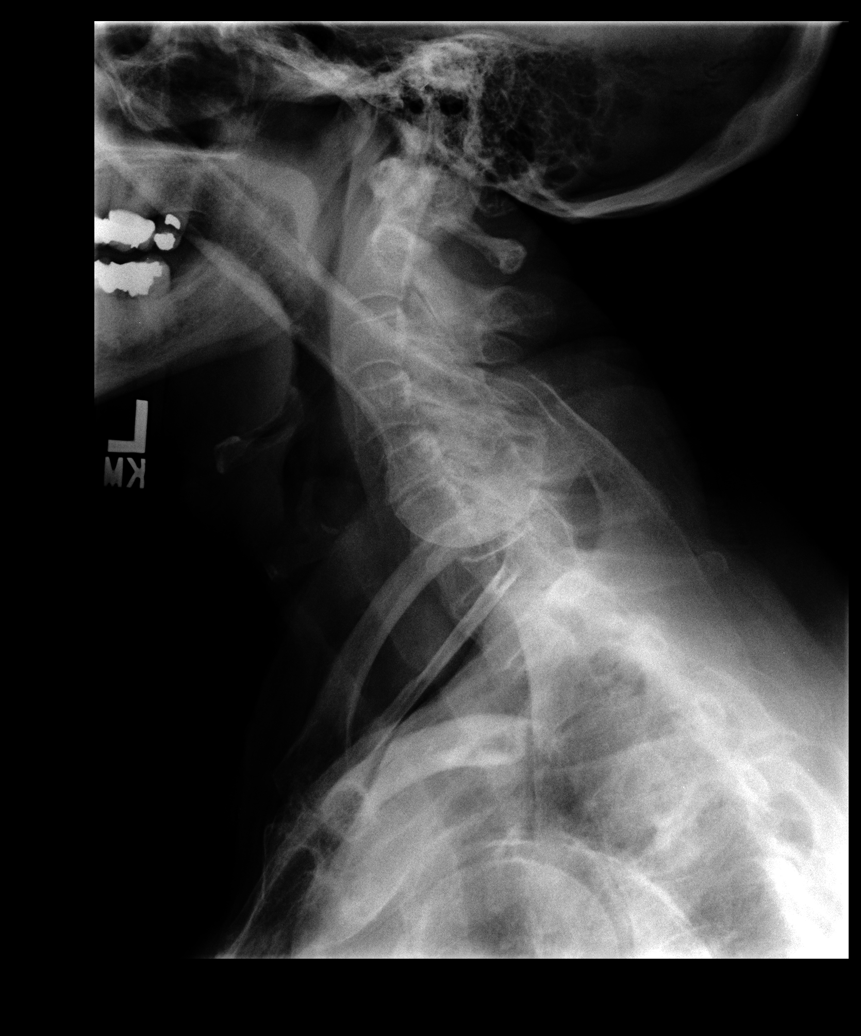

[5 of 5 positions shown; findings below may reference images not displayed]

FINDINGS: Diffuse degenerative changes cervical spine with particular
prominent disc space loss at C5-C6 and C6-C7. Prominent vertebral
endplate osteophytes at these levels. Diffuse facet hypertrophy is
present. No acute bony or joint abnormality identified. Soft tissue
structures are unremarkable. Pulmonary apices are clear.
IMPRESSION: Diffuse degenerative change, particularly severe C5-C6 and C6-C7. No
acute abnormality identified.

## 2016-06-16 DIAGNOSIS — H3530 Unspecified macular degeneration: Secondary | ICD-10-CM | POA: Diagnosis not present

## 2016-06-16 DIAGNOSIS — H401133 Primary open-angle glaucoma, bilateral, severe stage: Secondary | ICD-10-CM | POA: Diagnosis not present

## 2016-06-21 DIAGNOSIS — H401133 Primary open-angle glaucoma, bilateral, severe stage: Secondary | ICD-10-CM | POA: Diagnosis not present

## 2016-06-21 DIAGNOSIS — H409 Unspecified glaucoma: Secondary | ICD-10-CM | POA: Diagnosis not present

## 2016-06-21 DIAGNOSIS — H401113 Primary open-angle glaucoma, right eye, severe stage: Secondary | ICD-10-CM | POA: Diagnosis not present

## 2016-09-09 DIAGNOSIS — J321 Chronic frontal sinusitis: Secondary | ICD-10-CM | POA: Diagnosis not present

## 2016-09-09 DIAGNOSIS — H6590 Unspecified nonsuppurative otitis media, unspecified ear: Secondary | ICD-10-CM | POA: Diagnosis not present

## 2016-09-09 DIAGNOSIS — H6121 Impacted cerumen, right ear: Secondary | ICD-10-CM | POA: Diagnosis not present

## 2016-09-09 DIAGNOSIS — H6980 Other specified disorders of Eustachian tube, unspecified ear: Secondary | ICD-10-CM | POA: Diagnosis not present

## 2016-09-09 DIAGNOSIS — J0410 Acute tracheitis without obstruction: Secondary | ICD-10-CM | POA: Diagnosis not present

## 2016-10-04 DIAGNOSIS — H353133 Nonexudative age-related macular degeneration, bilateral, advanced atrophic without subfoveal involvement: Secondary | ICD-10-CM | POA: Diagnosis not present

## 2016-10-04 DIAGNOSIS — H35371 Puckering of macula, right eye: Secondary | ICD-10-CM | POA: Diagnosis not present

## 2016-10-04 DIAGNOSIS — H26491 Other secondary cataract, right eye: Secondary | ICD-10-CM | POA: Diagnosis not present

## 2016-10-04 DIAGNOSIS — H401133 Primary open-angle glaucoma, bilateral, severe stage: Secondary | ICD-10-CM | POA: Diagnosis not present

## 2016-10-04 DIAGNOSIS — H33101 Unspecified retinoschisis, right eye: Secondary | ICD-10-CM | POA: Diagnosis not present

## 2016-10-11 DIAGNOSIS — H26491 Other secondary cataract, right eye: Secondary | ICD-10-CM | POA: Diagnosis not present

## 2016-12-24 ENCOUNTER — Ambulatory Visit
Admission: RE | Admit: 2016-12-24 | Discharge: 2016-12-24 | Disposition: A | Discharge status: Home / Self Care | Payer: Medicare Other | Source: Ambulatory Visit | Attending: Internal Medicine | Admitting: Internal Medicine

## 2016-12-24 ENCOUNTER — Other Ambulatory Visit: Payer: Self-pay | Admitting: Internal Medicine

## 2016-12-24 DIAGNOSIS — Z7984 Long term (current) use of oral hypoglycemic drugs: Secondary | ICD-10-CM | POA: Diagnosis not present

## 2016-12-24 DIAGNOSIS — M12841 Other specific arthropathies, not elsewhere classified, right hand: Secondary | ICD-10-CM | POA: Diagnosis not present

## 2016-12-24 DIAGNOSIS — M19041 Primary osteoarthritis, right hand: Secondary | ICD-10-CM | POA: Diagnosis not present

## 2016-12-24 DIAGNOSIS — E119 Type 2 diabetes mellitus without complications: Secondary | ICD-10-CM | POA: Diagnosis not present

## 2016-12-24 DIAGNOSIS — M19049 Primary osteoarthritis, unspecified hand: Secondary | ICD-10-CM

## 2016-12-28 ENCOUNTER — Ambulatory Visit (HOSPITAL_BASED_OUTPATIENT_CLINIC_OR_DEPARTMENT_OTHER): Payer: Medicare Other | Admitting: Anesthesiology

## 2016-12-28 ENCOUNTER — Ambulatory Visit (HOSPITAL_BASED_OUTPATIENT_CLINIC_OR_DEPARTMENT_OTHER)
Admission: RE | Admit: 2016-12-28 | Discharge: 2016-12-28 | Disposition: A | Discharge status: Home / Self Care | Payer: Medicare Other | Source: Ambulatory Visit | Attending: Orthopedic Surgery | Admitting: Orthopedic Surgery

## 2016-12-28 ENCOUNTER — Encounter (HOSPITAL_BASED_OUTPATIENT_CLINIC_OR_DEPARTMENT_OTHER)
Admission: RE | Disposition: A | Discharge status: Home / Self Care | Payer: Self-pay | Source: Ambulatory Visit | Attending: Orthopedic Surgery

## 2016-12-28 ENCOUNTER — Encounter (HOSPITAL_BASED_OUTPATIENT_CLINIC_OR_DEPARTMENT_OTHER): Payer: Self-pay | Admitting: *Deleted

## 2016-12-28 DIAGNOSIS — I10 Essential (primary) hypertension: Secondary | ICD-10-CM | POA: Diagnosis not present

## 2016-12-28 DIAGNOSIS — Z885 Allergy status to narcotic agent status: Secondary | ICD-10-CM | POA: Insufficient documentation

## 2016-12-28 DIAGNOSIS — Z7982 Long term (current) use of aspirin: Secondary | ICD-10-CM | POA: Insufficient documentation

## 2016-12-28 DIAGNOSIS — M1990 Unspecified osteoarthritis, unspecified site: Secondary | ICD-10-CM | POA: Diagnosis not present

## 2016-12-28 DIAGNOSIS — Z7984 Long term (current) use of oral hypoglycemic drugs: Secondary | ICD-10-CM | POA: Diagnosis not present

## 2016-12-28 DIAGNOSIS — R079 Chest pain, unspecified: Secondary | ICD-10-CM | POA: Diagnosis not present

## 2016-12-28 DIAGNOSIS — G4700 Insomnia, unspecified: Secondary | ICD-10-CM | POA: Insufficient documentation

## 2016-12-28 DIAGNOSIS — Z888 Allergy status to other drugs, medicaments and biological substances status: Secondary | ICD-10-CM | POA: Insufficient documentation

## 2016-12-28 DIAGNOSIS — M1A9XX0 Chronic gout, unspecified, without tophus (tophi): Secondary | ICD-10-CM | POA: Diagnosis not present

## 2016-12-28 DIAGNOSIS — F419 Anxiety disorder, unspecified: Secondary | ICD-10-CM | POA: Diagnosis not present

## 2016-12-28 DIAGNOSIS — Z90710 Acquired absence of both cervix and uterus: Secondary | ICD-10-CM | POA: Insufficient documentation

## 2016-12-28 DIAGNOSIS — Z96651 Presence of right artificial knee joint: Secondary | ICD-10-CM | POA: Diagnosis not present

## 2016-12-28 DIAGNOSIS — M79641 Pain in right hand: Secondary | ICD-10-CM | POA: Diagnosis not present

## 2016-12-28 DIAGNOSIS — Z8249 Family history of ischemic heart disease and other diseases of the circulatory system: Secondary | ICD-10-CM | POA: Diagnosis not present

## 2016-12-28 DIAGNOSIS — Z79899 Other long term (current) drug therapy: Secondary | ICD-10-CM | POA: Insufficient documentation

## 2016-12-28 DIAGNOSIS — Z91040 Latex allergy status: Secondary | ICD-10-CM | POA: Insufficient documentation

## 2016-12-28 DIAGNOSIS — F329 Major depressive disorder, single episode, unspecified: Secondary | ICD-10-CM | POA: Diagnosis not present

## 2016-12-28 DIAGNOSIS — M10041 Idiopathic gout, right hand: Secondary | ICD-10-CM | POA: Diagnosis not present

## 2016-12-28 DIAGNOSIS — M1A0411 Idiopathic chronic gout, right hand, with tophus (tophi): Secondary | ICD-10-CM | POA: Insufficient documentation

## 2016-12-28 DIAGNOSIS — E119 Type 2 diabetes mellitus without complications: Secondary | ICD-10-CM | POA: Diagnosis not present

## 2016-12-28 DIAGNOSIS — Z882 Allergy status to sulfonamides status: Secondary | ICD-10-CM | POA: Diagnosis not present

## 2016-12-28 DIAGNOSIS — L02511 Cutaneous abscess of right hand: Secondary | ICD-10-CM | POA: Insufficient documentation

## 2016-12-28 HISTORY — PX: INCISION AND DRAINAGE: SHX5863

## 2016-12-28 LAB — GLUCOSE, CAPILLARY
GLUCOSE-CAPILLARY: 125 mg/dL — AB (ref 65–99)
Glucose-Capillary: 107 mg/dL — ABNORMAL HIGH (ref 65–99)

## 2016-12-28 SURGERY — INCISION AND DRAINAGE
Anesthesia: Monitor Anesthesia Care | Site: Finger | Laterality: Right

## 2016-12-28 SURGERY — IRRIGATION AND DEBRIDEMENT EXTREMITY
Anesthesia: Choice | Laterality: Right

## 2016-12-28 MED ORDER — POVIDONE-IODINE 10 % EX SWAB: 2.0000 "application " | Freq: Once | CUTANEOUS | Status: DC

## 2016-12-28 MED ORDER — ONDANSETRON HCL 4 MG/2ML IJ SOLN: 4.0000 mg | Freq: Once | INTRAMUSCULAR | Status: DC | PRN

## 2016-12-28 MED ORDER — CEFAZOLIN SODIUM-DEXTROSE 2-4 GM/100ML-% IV SOLN
INTRAVENOUS | Status: AC
Start: 2016-12-28 — End: ?
  Filled 2016-12-28: qty 100

## 2016-12-28 MED ORDER — CHLORHEXIDINE GLUCONATE 4 % EX LIQD: 60.0000 mL | Freq: Once | CUTANEOUS | Status: DC

## 2016-12-28 MED ORDER — FENTANYL CITRATE (PF) 100 MCG/2ML IJ SOLN: 25.0000 ug | INTRAMUSCULAR | Status: DC | PRN

## 2016-12-28 MED ORDER — ALCOHOL 98 % IJ SOLN
INTRAMUSCULAR | Status: AC
  Filled 2016-12-28: qty 1

## 2016-12-28 MED ORDER — PROPOFOL 10 MG/ML IV BOLUS
INTRAVENOUS | Status: AC
  Filled 2016-12-28: qty 20

## 2016-12-28 MED ORDER — CEFAZOLIN SODIUM-DEXTROSE 2-3 GM-% IV SOLR
INTRAVENOUS | Status: DC | PRN
  Administered 2016-12-28: 2 g via INTRAVENOUS

## 2016-12-28 MED ORDER — BUPIVACAINE HCL (PF) 0.5 % IJ SOLN
INTRAMUSCULAR | Status: DC | PRN
  Administered 2016-12-28: 9.5 mL

## 2016-12-28 MED ORDER — PROPOFOL 500 MG/50ML IV EMUL
INTRAVENOUS | Status: DC | PRN
  Administered 2016-12-28: 75 ug/kg/min via INTRAVENOUS

## 2016-12-28 MED ORDER — HYDROCODONE-ACETAMINOPHEN 5-325 MG PO TABS: ORAL_TABLET | ORAL | 0 refills | Status: DC

## 2016-12-28 MED ORDER — LACTATED RINGERS IV SOLN
INTRAVENOUS | Status: DC | PRN
  Administered 2016-12-28: 14:00:00 via INTRAVENOUS

## 2016-12-28 MED ORDER — FENTANYL CITRATE (PF) 100 MCG/2ML IJ SOLN
INTRAMUSCULAR | Status: DC | PRN
  Administered 2016-12-28: 50 ug via INTRAVENOUS

## 2016-12-28 MED ORDER — FENTANYL CITRATE (PF) 100 MCG/2ML IJ SOLN
INTRAMUSCULAR | Status: AC
  Filled 2016-12-28: qty 2

## 2016-12-28 MED ORDER — BUPIVACAINE HCL (PF) 0.5 % IJ SOLN
INTRAMUSCULAR | Status: AC
  Filled 2016-12-28: qty 30

## 2016-12-28 MED ORDER — ONDANSETRON HCL 4 MG/2ML IJ SOLN
INTRAMUSCULAR | Status: AC
  Filled 2016-12-28: qty 2

## 2016-12-28 SURGICAL SUPPLY — 55 items
BAG DECANTER FOR FLEXI CONT (MISCELLANEOUS) IMPLANT
BANDAGE ACE 3X5.8 VEL STRL LF (GAUZE/BANDAGES/DRESSINGS) IMPLANT
BLADE MINI RND TIP GREEN BEAV (BLADE) IMPLANT
BLADE SURG 15 STRL LF DISP TIS (BLADE) ×2 IMPLANT
BLADE SURG 15 STRL SS (BLADE) ×4
BNDG CMPR 9X4 STRL LF SNTH (GAUZE/BANDAGES/DRESSINGS) ×1
BNDG COHESIVE 1X5 TAN STRL LF (GAUZE/BANDAGES/DRESSINGS) IMPLANT
BNDG ELASTIC 2X5.8 VLCR STR LF (GAUZE/BANDAGES/DRESSINGS) IMPLANT
BNDG ESMARK 4X9 LF (GAUZE/BANDAGES/DRESSINGS) ×1 IMPLANT
BNDG GAUZE 1X2.1 STRL (MISCELLANEOUS) IMPLANT
BNDG GAUZE ELAST 4 BULKY (GAUZE/BANDAGES/DRESSINGS) IMPLANT
CHLORAPREP W/TINT 26ML (MISCELLANEOUS) ×3 IMPLANT
CORD BIPOLAR FORCEPS 12FT (ELECTRODE) ×2 IMPLANT
COVER BACK TABLE 60X90IN (DRAPES) ×2 IMPLANT
COVER MAYO STAND STRL (DRAPES) ×2 IMPLANT
CUFF TOURNIQUET SINGLE 18IN (TOURNIQUET CUFF) ×2 IMPLANT
DRAPE EXTREMITY T 121X128X90 (DRAPE) ×2 IMPLANT
DRAPE SURG 17X23 STRL (DRAPES) ×2 IMPLANT
GAUZE PACKING IODOFORM 1/4X15 (GAUZE/BANDAGES/DRESSINGS) IMPLANT
GAUZE SPONGE 4X4 12PLY STRL (GAUZE/BANDAGES/DRESSINGS) ×2 IMPLANT
GAUZE XEROFORM 1X8 LF (GAUZE/BANDAGES/DRESSINGS) ×2 IMPLANT
GLOVE BIO SURGEON STRL SZ7.5 (GLOVE) ×1 IMPLANT
GLOVE BIOGEL PI IND STRL 6.5 (GLOVE) IMPLANT
GLOVE BIOGEL PI IND STRL 7.0 (GLOVE) IMPLANT
GLOVE BIOGEL PI IND STRL 8 (GLOVE) ×1 IMPLANT
GLOVE BIOGEL PI INDICATOR 6.5 (GLOVE) ×2
GLOVE BIOGEL PI INDICATOR 7.0 (GLOVE) ×2
GLOVE BIOGEL PI INDICATOR 8 (GLOVE) ×1
GLOVE SURG SS PI 7.5 STRL IVOR (GLOVE) ×1 IMPLANT
GOWN STRL REUS W/ TWL LRG LVL3 (GOWN DISPOSABLE) ×1 IMPLANT
GOWN STRL REUS W/TWL LRG LVL3 (GOWN DISPOSABLE) ×2
GOWN STRL REUS W/TWL XL LVL3 (GOWN DISPOSABLE) ×2 IMPLANT
LOOP VESSEL MAXI BLUE (MISCELLANEOUS) IMPLANT
NDL HYPO 25X1 1.5 SAFETY (NEEDLE) IMPLANT
NEEDLE HYPO 25X1 1.5 SAFETY (NEEDLE) ×2 IMPLANT
NS IRRIG 1000ML POUR BTL (IV SOLUTION) ×2 IMPLANT
PACK BASIN DAY SURGERY FS (CUSTOM PROCEDURE TRAY) ×2 IMPLANT
PAD CAST 3X4 CTTN HI CHSV (CAST SUPPLIES) IMPLANT
PADDING CAST ABS 4INX4YD NS (CAST SUPPLIES)
PADDING CAST ABS COTTON 4X4 ST (CAST SUPPLIES) ×1 IMPLANT
PADDING CAST COTTON 3X4 STRL (CAST SUPPLIES)
SPLINT FINGER 3.25 911903 (SOFTGOODS) ×1 IMPLANT
SPLINT PLASTER CAST XFAST 3X15 (CAST SUPPLIES) IMPLANT
SPLINT PLASTER XTRA FASTSET 3X (CAST SUPPLIES)
STOCKINETTE 4X48 STRL (DRAPES) ×2 IMPLANT
SUT ETHILON 4 0 P 3 18 (SUTURE) ×1 IMPLANT
SUT ETHILON 4 0 PS 2 18 (SUTURE) IMPLANT
SWAB COLLECTION DEVICE MRSA (MISCELLANEOUS) ×1 IMPLANT
SWAB CULTURE ESWAB REG 1ML (MISCELLANEOUS) ×1 IMPLANT
SYR 20CC LL (SYRINGE) IMPLANT
SYR BULB 3OZ (MISCELLANEOUS) ×2 IMPLANT
SYR CONTROL 10ML LL (SYRINGE) ×1 IMPLANT
TOWEL OR 17X24 6PK STRL BLUE (TOWEL DISPOSABLE) ×4 IMPLANT
TUBE FEEDING ENTERAL 5FR 16IN (TUBING) IMPLANT
UNDERPAD 30X30 (UNDERPADS AND DIAPERS) ×2 IMPLANT

## 2016-12-28 NOTE — Transfer of Care (Signed)
Immediate Anesthesia Transfer of Care Note  Patient: Haley Campbell  Procedure(s) Performed: Procedure(s): INCISION AND DRAINAGE RIGHT LONG FINGER (Right)  Patient Location: PACU  Anesthesia Type:MAC  Level of Consciousness: awake, alert  and oriented  Airway & Oxygen Therapy: Patient Spontanous Breathing and Patient connected to face mask  Post-op Assessment: Report given to RN and Post -op Vital signs reviewed and stable  Post vital signs: Reviewed and stable  Last Vitals:  Vitals:   12/28/16 1314  BP: (!) 152/68  Pulse: 86  Resp: 18  Temp: 36.4 C    Last Pain:  Vitals:   12/28/16 1314  TempSrc: Oral  PainSc: 2       Patients Stated Pain Goal: 2 (59/29/24 4628)  Complications: No apparent anesthesia complications

## 2016-12-28 NOTE — Brief Op Note (Signed)
12/28/2016  3:49 PM  PATIENT:  Haley Campbell  83 y.o. female  PRE-OPERATIVE DIAGNOSIS:  RIGHT LONG FINGER GOUT TOPHUS  POST-OPERATIVE DIAGNOSIS:  RIGHT LONG FINGER GOUT TOPHUS  PROCEDURE:  Procedure(s): INCISION AND DRAINAGE RIGHT LONG FINGER (Right), rotation flap less than 10 square cm  SURGEON:  Surgeon(s) and Role:    * Leanora Cover, MD - Primary  PHYSICIAN ASSISTANT:   ASSISTANTS: none   ANESTHESIA:   local and MAC  EBL:  Total I/O In: 300 [I.V.:300] Out: -   BLOOD ADMINISTERED:none  DRAINS: none   LOCAL MEDICATIONS USED:  MARCAINE     SPECIMEN:  Source of Specimen:  right long finger  DISPOSITION OF SPECIMEN:  cultures to micro, tophus to pathology  COUNTS:  YES  TOURNIQUET:   Total Tourniquet Time Documented: Upper Arm (Right) - 28 minutes Total: Upper Arm (Right) - 28 minutes   DICTATION: .Other Dictation: Dictation Number [id number]  PLAN OF CARE: Discharge to home after PACU  PATIENT DISPOSITION:  PACU - hemodynamically stable.

## 2016-12-28 NOTE — Op Note (Signed)
[id number] 

## 2016-12-28 NOTE — Anesthesia Postprocedure Evaluation (Signed)
Anesthesia Post Note  Patient: Haley Campbell  Procedure(s) Performed: Procedure(s) (LRB): INCISION AND DRAINAGE RIGHT LONG FINGER (Right)     Patient location during evaluation: PACU Anesthesia Type: MAC Level of consciousness: awake and alert Pain management: pain level controlled Vital Signs Assessment: post-procedure vital signs reviewed and stable Respiratory status: spontaneous breathing, nonlabored ventilation, respiratory function stable and patient connected to nasal cannula oxygen Cardiovascular status: stable and blood pressure returned to baseline Anesthetic complications: no    Last Vitals:  Vitals:   12/28/16 1610 12/28/16 1625  BP: 118/66 136/63  Pulse:  79  Resp:  16  Temp:  36.8 C    Last Pain:  Vitals:   12/28/16 1625  TempSrc:   PainSc: 0-No pain                 Ryan P Ellender

## 2016-12-28 NOTE — Discharge Instructions (Signed)

## 2016-12-28 NOTE — Anesthesia Preprocedure Evaluation (Addendum)
Anesthesia Evaluation  Patient identified by MRN, date of birth, ID band Patient awake    Reviewed: Allergy & Precautions, H&P , NPO status , Patient's Chart, lab work & pertinent test results  History of Anesthesia Complications (+) PONV  Airway Mallampati: III  TM Distance: >3 FB Neck ROM: Full    Dental no notable dental hx. (+) Teeth Intact, Dental Advisory Given   Pulmonary neg pulmonary ROS,    Pulmonary exam normal breath sounds clear to auscultation       Cardiovascular hypertension, Pt. on medications  Rhythm:Regular Rate:Normal     Neuro/Psych  Headaches, PSYCHIATRIC DISORDERS Anxiety Depression    GI/Hepatic negative GI ROS, Neg liver ROS,   Endo/Other  diabetes, Type 2, Oral Hypoglycemic Agents  Renal/GU negative Renal ROS     Musculoskeletal  (+) Arthritis , Osteoarthritis,    Abdominal   Peds  Hematology negative hematology ROS (+)   Anesthesia Other Findings   Reproductive/Obstetrics                            Anesthesia Physical  Anesthesia Plan  ASA: III  Anesthesia Plan: MAC   Post-op Pain Management:    Induction: Intravenous  PONV Risk Score and Plan: 3 and Ondansetron, Dexamethasone, Propofol infusion and Treatment may vary due to age or medical condition  Airway Management Planned: Simple Face Mask  Additional Equipment:   Intra-op Plan:   Post-operative Plan:   Informed Consent: I have reviewed the patients History and Physical, chart, labs and discussed the procedure including the risks, benefits and alternatives for the proposed anesthesia with the patient or authorized representative who has indicated his/her understanding and acceptance.   Dental advisory given  Plan Discussed with: CRNA and Surgeon  Anesthesia Plan Comments:        Anesthesia Quick Evaluation

## 2016-12-28 NOTE — H&P (Signed)
Haley Campbell is an 83 y.o. female.   Chief Complaint: right long finger abscess/gout HPI: 83 yo female with 1 week of increasing pain/swelling of right long finger dip joint.  No fevers, chills, night sweats.  History of gout.  No noted injury.  Allergies:  Allergies  Allergen Reactions  . Latex Other (See Comments)    Severe mucosal allergy  . Codeine Nausea Only  . Colchicine Diarrhea and Nausea And Vomiting  . Hctz [Hydrochlorothiazide] Nausea Only and Other (See Comments)    Fever and Headache  . Meperidine     Other reaction(s): RASH  . Sonata [Zaleplon] Nausea Only and Other (See Comments)    No sleep  . Sulfa Antibiotics Rash    Past Medical History:  Diagnosis Date  . Allergic rhinitis   . Anxiety   . Degenerative disc disease    knees  . Depression   . Diabetes mellitus (HCC)    type 2 - fasting cbg 100-130  . Diverticulosis    with episodic diverticulitis  . Dyspepsia    persistent  . Gout   . HA (headache)    chronic  . History of blood transfusion   . Hypertension   . Hypertension   . Insomnia    severe  . Low back pain    Chronic  . Otosclerosis    with left stapedectomy  . PONV (postoperative nausea and vomiting)   . Sciatica    left lower extremity   . Tinnitus of both ears    Chronic severe, and mixed conductive and sensorineural hearing loss    Past Surgical History:  Procedure Laterality Date  . APPENDECTOMY    . BREAST REDUCTION SURGERY Bilateral   . CHOLECYSTECTOMY    . EYE SURGERY Bilateral    cataracts  . STAPEDECTOMY Left    middle ear  . TOTAL ABDOMINAL HYSTERECTOMY    . TOTAL HIP ARTHROPLASTY Left 07/01/2014   Procedure: TOTAL HIP ARTHROPLASTY;  Surgeon: Nestor Lewandowsky, MD;  Location: [facility] OR;  Service: Orthopedics;  Laterality: Left;  . TOTAL KNEE ARTHROPLASTY Right     Family History: Family History  Problem Relation Age of Onset  . CVA Mother   . CAD Father   . Hypertension Brother   . Prostate cancer Brother   .  Cancer Sister        BRAIN TUMOR    Social History:   reports that she has never smoked. She has never used smokeless tobacco. She reports that she does not drink alcohol or use drugs.  Medications: Medications Prior to Admission  Medication Sig Dispense Refill  . ALPRAZolam (XANAX) 0.5 MG tablet Take 0.5 mg by mouth at bedtime as needed for anxiety.    Marland Kitchen aspirin EC 325 MG tablet Take 1 tablet (325 mg total) by mouth 2 (two) times daily. (Patient taking differently: Take 81 mg by mouth once. ) 30 tablet 0  . cephALEXin (KEFLEX) 500 MG capsule Take 500 mg by mouth 2 (two) times daily.    . indomethacin (INDOCIN) 50 MG capsule Take 50 mg by mouth 2 (two) times daily with a meal.    . lisinopril (PRINIVIL,ZESTRIL) 10 MG tablet Take 10 mg by mouth daily.    . metFORMIN (GLUCOPHAGE) 500 MG tablet Take 500 mg by mouth 2 (two) times daily with a meal.    . traMADol (ULTRAM) 50 MG tablet Take 50 mg by mouth.    . zolpidem (AMBIEN) 10 MG tablet Take 10  mg by mouth at bedtime as needed for sleep.    Marland Kitchen. gabapentin (NEURONTIN) 300 MG capsule Take 1 capsule (300 mg total) by mouth 2 (two) times daily. (Patient not taking: Reported on 06/18/2014) 60 capsule 3  . glucose blood test strip 1 each by Other route as needed for other. Use as instructed    . ibuprofen (ADVIL,MOTRIN) 200 MG tablet Take 2 tablets (400 mg total) by mouth every 6 (six) hours as needed for fever or mild pain. 30 tablet 0  . latanoprost (XALATAN) 0.005 % ophthalmic solution Place 1 drop into the right eye at bedtime.     Marland Kitchen. oxyCODONE-acetaminophen (ROXICET) 5-325 MG per tablet Take 1 tablet by mouth every 4 (four) hours as needed. 60 tablet 0  . tizanidine (ZANAFLEX) 2 MG capsule Take 1 capsule (2 mg total) by mouth 3 (three) times daily. 60 capsule 0    Results for orders placed or performed during the hospital encounter of 12/28/16 (from the past 48 hour(s))  Glucose, capillary     Status: Abnormal   Collection Time: 12/28/16  1:08  PM  Result Value Ref Range   Glucose-Capillary 107 (H) 65 - 99 mg/dL    No results found.   A comprehensive review of systems was negative.  Blood pressure (!) 152/68, pulse 86, temperature 97.6 F (36.4 C), temperature source Oral, resp. rate 18, height 5' 2.5" (1.588 m), weight 68.2 kg (150 lb 6.4 oz), SpO2 100 %.  General appearance: alert, cooperative and appears stated age Head: Normocephalic, without obvious abnormality, atraumatic Neck: supple, symmetrical, trachea midline Resp: clear to auscultation bilaterally Cardio: regular rate and rhythm GI: non-tender Extremities: Intact sensation and capillary refill all digits.  +epl/fpl/io.  No wounds.  Pulses: 2+ and symmetric Skin: Skin color, texture, turgor normal. No rashes or lesions Neurologic: Grossly normal Incision/Wound:none  Assessment/Plan Right long finger abscess at dip joint vs gouty tophus.  Plan incision and drainage right long finger with rotation flap if necessary for wound coverage.  Non operative and operative treatment options were discussed with the patient and patient wishes to proceed with operative treatment. Risks, benefits, and alternatives of surgery were discussed and the patient agrees with the plan of care.   Haley Campbell 12/28/2016, 1:25 PM

## 2016-12-29 ENCOUNTER — Encounter (HOSPITAL_BASED_OUTPATIENT_CLINIC_OR_DEPARTMENT_OTHER): Payer: Self-pay | Admitting: Orthopedic Surgery

## 2016-12-29 DIAGNOSIS — H35371 Puckering of macula, right eye: Secondary | ICD-10-CM | POA: Diagnosis not present

## 2016-12-29 DIAGNOSIS — H33101 Unspecified retinoschisis, right eye: Secondary | ICD-10-CM | POA: Diagnosis not present

## 2016-12-29 DIAGNOSIS — H401133 Primary open-angle glaucoma, bilateral, severe stage: Secondary | ICD-10-CM | POA: Diagnosis not present

## 2016-12-29 NOTE — Op Note (Signed)
Haley, Campbell                  ACCOUNT NO.:  [id number]  MEDICAL RECORD NO.:  [id number]  LOCATION:                                 FACILITY:  PHYSICIAN:  Leanora Cover, MD             DATE OF BIRTH:  DATE OF PROCEDURE:  12/28/2016 DATE OF DISCHARGE:                              OPERATIVE REPORT   PREOPERATIVE DIAGNOSIS:  Right long finger gouty tophus.  POSTOPERATIVE DIAGNOSIS:  Right long finger gouty tophus.  PROCEDURE:   1. Incision and drainage of right long finger DIP joint for gouty tophus  2. Rotation flap less than 10 square cm for coverage of open joint  SURGEON:  Leanora Cover, MD.  ASSISTANT:  None.  ANESTHESIA:  MAC with local.  IV FLUIDS:  Per Anesthesia flow sheet.  ESTIMATED BLOOD LOSS:  Minimal.  COMPLICATIONS:  None.  SPECIMENS:  Cultures to Micro and tophus to Pathology.  TOURNIQUET TIME:  28 minutes.  DISPOSITION:  Stable to PACU.  INDICATIONS:  [name] is a 83 year old female, who has had issues with the right long finger over the past week.  She has had increasing pain and swelling at the DIP joint.  She has noticed over the past couple of days a large whitish bulla.  She presented to the office today.  I recommended incision and drainage for possible abscess versus gouty tophus with possible rotation flap if necessary for coverage.  Risks, benefits, and alternatives of the surgery were discussed including the risk of blood loss; infection; damage to nerves, vessels, tendons, ligaments, bone for surgery, need for additional surgery, complications with wound healing, continued pain, and recurrence of tophus.  She voiced understanding of these risks and elected to proceed.  OPERATIVE COURSE:  After being identified preoperatively by myself, the patient and I agreed upon the procedure and site of procedure.  Surgical site was marked.  The risks, benefits, and alternatives of the surgery were reviewed and she wished to proceed.  Surgical  consent had been signed.  Antibiotics were held for cultures.  She was transferred to the operating room and placed on the operating room table in supine position with the right upper extremity on arm board.  MAC anesthesia was induced by anesthesiologist.  A digital block was performed with 9 mL of 0.5% plain Marcaine to aid in postoperative analgesia and intraoperative anesthesia.  The right upper extremity was prepped and draped in normal sterile orthopedic fashion.  Surgical pause was performed again between surgeons, anesthesia, and operating room staff; and all were in agreement as to the patient procedure and site of procedure.  Tourniquet at the proximal aspect of the forearm was inflated to 250 mmHg after exsanguination of the limb with an Esmarch bandage.  Incision was made at the ulnar side of the DIP joint and the bulla.  This was carried into the subcutaneous tissues.  There was an immediate expression of whitish pasty material.  Cultures were taken for aerobes and anaerobes, and some of the material was sent to Pathology for examination.  This appeared like a gouty tophus.  The skin over top was thinned and  nonviable.  This was removed sharply with the scissors.  There was a gouty tophus.  There was erosion of the epidermis and dermis central and ulnarly at the DIP joint.  This communicated with the joint.  There was gouty tophus within the joint.  Curette was used to remove gouty tophus from within the joint.  The wound was copiously irrigated with sterile saline.  It was felt that coverage would be needed over the open portion of the joint. A rotation flap was formed.  The skin and subcutaneous tissues were mobilized as a single mass over the extensor tendon.  This allowed the skin to be rotated to cover the open portion of the joint.  There was some area distally and ulnarly that could not be covered completely with epidermis, but there was intact dermis.  The flap was  then secured with 4-0 nylon suture in interrupted and horizontal mattress fashion.  There was not undue tension.  The wound was dressed with sterile Xeroform and 4 x 4 and wrapped lightly with a Coban dressing.  An AlumaFoam splint was placed and wrapped lightly with Coban dressing.  Tourniquet was deflated at 28 minutes.  Fingertips were pink with brisk capillary refill after deflation of tourniquet.  The operative drapes were broken down and the patient was awoken from anesthesia safely.  She was transferred back to stretcher and taken to PACU in stable condition.  I will see her back in the office within the week for postoperative followup.  I will give her Norco 5/325 one to two p.o. q.6 hours p.r.n. pain, dispensed #20.     Leanora Cover, MD     KK/MEDQ  D:  12/28/2016  T:  12/29/2016  Job:  [id number]

## 2017-01-02 LAB — AEROBIC/ANAEROBIC CULTURE W GRAM STAIN (SURGICAL/DEEP WOUND): Culture: NO GROWTH

## 2017-01-02 LAB — AEROBIC/ANAEROBIC CULTURE (SURGICAL/DEEP WOUND)

## 2017-01-10 ENCOUNTER — Other Ambulatory Visit: Payer: Self-pay | Admitting: Orthopedic Surgery

## 2017-01-10 DIAGNOSIS — M1A0411 Idiopathic chronic gout, right hand, with tophus (tophi): Secondary | ICD-10-CM | POA: Diagnosis not present

## 2017-01-10 DIAGNOSIS — M1A9XX1 Chronic gout, unspecified, with tophus (tophi): Secondary | ICD-10-CM | POA: Diagnosis not present

## 2017-02-11 DIAGNOSIS — I10 Essential (primary) hypertension: Secondary | ICD-10-CM | POA: Diagnosis not present

## 2017-02-11 DIAGNOSIS — Z23 Encounter for immunization: Secondary | ICD-10-CM | POA: Diagnosis not present

## 2017-02-11 DIAGNOSIS — E119 Type 2 diabetes mellitus without complications: Secondary | ICD-10-CM | POA: Diagnosis not present

## 2017-02-11 DIAGNOSIS — B354 Tinea corporis: Secondary | ICD-10-CM | POA: Diagnosis not present

## 2017-02-11 DIAGNOSIS — Z1389 Encounter for screening for other disorder: Secondary | ICD-10-CM | POA: Diagnosis not present

## 2017-02-11 DIAGNOSIS — F322 Major depressive disorder, single episode, severe without psychotic features: Secondary | ICD-10-CM | POA: Diagnosis not present

## 2017-02-11 DIAGNOSIS — J321 Chronic frontal sinusitis: Secondary | ICD-10-CM | POA: Diagnosis not present

## 2017-02-11 DIAGNOSIS — Z0000 Encounter for general adult medical examination without abnormal findings: Secondary | ICD-10-CM | POA: Diagnosis not present

## 2017-02-11 DIAGNOSIS — F329 Major depressive disorder, single episode, unspecified: Secondary | ICD-10-CM | POA: Diagnosis not present

## 2017-02-11 DIAGNOSIS — M109 Gout, unspecified: Secondary | ICD-10-CM | POA: Diagnosis not present

## 2017-02-11 DIAGNOSIS — E559 Vitamin D deficiency, unspecified: Secondary | ICD-10-CM | POA: Diagnosis not present

## 2017-02-11 DIAGNOSIS — G4700 Insomnia, unspecified: Secondary | ICD-10-CM | POA: Diagnosis not present

## 2017-02-11 DIAGNOSIS — F419 Anxiety disorder, unspecified: Secondary | ICD-10-CM | POA: Diagnosis not present

## 2017-02-11 DIAGNOSIS — M179 Osteoarthritis of knee, unspecified: Secondary | ICD-10-CM | POA: Diagnosis not present

## 2017-02-11 DIAGNOSIS — Z79899 Other long term (current) drug therapy: Secondary | ICD-10-CM | POA: Diagnosis not present

## 2017-02-11 DIAGNOSIS — E669 Obesity, unspecified: Secondary | ICD-10-CM | POA: Diagnosis not present

## 2017-02-23 DIAGNOSIS — M1A0411 Idiopathic chronic gout, right hand, with tophus (tophi): Secondary | ICD-10-CM | POA: Diagnosis not present

## 2017-02-23 DIAGNOSIS — M79644 Pain in right finger(s): Secondary | ICD-10-CM | POA: Diagnosis not present

## 2017-06-02 DIAGNOSIS — M7662 Achilles tendinitis, left leg: Secondary | ICD-10-CM | POA: Diagnosis not present

## 2017-07-21 DIAGNOSIS — H35371 Puckering of macula, right eye: Secondary | ICD-10-CM | POA: Diagnosis not present

## 2017-07-21 DIAGNOSIS — H52223 Regular astigmatism, bilateral: Secondary | ICD-10-CM | POA: Diagnosis not present

## 2017-07-21 DIAGNOSIS — H35363 Drusen (degenerative) of macula, bilateral: Secondary | ICD-10-CM | POA: Diagnosis not present

## 2017-07-21 DIAGNOSIS — H401133 Primary open-angle glaucoma, bilateral, severe stage: Secondary | ICD-10-CM | POA: Diagnosis not present

## 2017-08-11 DIAGNOSIS — M5442 Lumbago with sciatica, left side: Secondary | ICD-10-CM | POA: Diagnosis not present

## 2017-08-29 DIAGNOSIS — H90A31 Mixed conductive and sensorineural hearing loss, unilateral, right ear with restricted hearing on the contralateral side: Secondary | ICD-10-CM | POA: Diagnosis not present

## 2017-08-29 DIAGNOSIS — Z6826 Body mass index (BMI) 26.0-26.9, adult: Secondary | ICD-10-CM | POA: Diagnosis not present

## 2017-08-29 DIAGNOSIS — H9311 Tinnitus, right ear: Secondary | ICD-10-CM | POA: Diagnosis not present

## 2017-08-29 DIAGNOSIS — H90A22 Sensorineural hearing loss, unilateral, left ear, with restricted hearing on the contralateral side: Secondary | ICD-10-CM | POA: Diagnosis not present

## 2017-08-29 DIAGNOSIS — H6123 Impacted cerumen, bilateral: Secondary | ICD-10-CM | POA: Diagnosis not present

## 2017-09-22 DIAGNOSIS — M5442 Lumbago with sciatica, left side: Secondary | ICD-10-CM | POA: Diagnosis not present

## 2017-09-26 DIAGNOSIS — Z6826 Body mass index (BMI) 26.0-26.9, adult: Secondary | ICD-10-CM | POA: Diagnosis not present

## 2017-09-26 DIAGNOSIS — M792 Neuralgia and neuritis, unspecified: Secondary | ICD-10-CM | POA: Diagnosis not present

## 2017-11-29 DIAGNOSIS — H401133 Primary open-angle glaucoma, bilateral, severe stage: Secondary | ICD-10-CM | POA: Diagnosis not present

## 2017-11-29 DIAGNOSIS — H35363 Drusen (degenerative) of macula, bilateral: Secondary | ICD-10-CM | POA: Diagnosis not present

## 2018-01-12 DIAGNOSIS — M25552 Pain in left hip: Secondary | ICD-10-CM | POA: Diagnosis not present

## 2018-01-16 ENCOUNTER — Other Ambulatory Visit (HOSPITAL_COMMUNITY): Payer: Self-pay | Admitting: Orthopedic Surgery

## 2018-01-16 DIAGNOSIS — M25552 Pain in left hip: Secondary | ICD-10-CM

## 2018-01-18 ENCOUNTER — Encounter (HOSPITAL_COMMUNITY)
Admission: RE | Admit: 2018-01-18 | Discharge: 2018-01-18 | Disposition: A | Discharge status: Home / Self Care | Payer: Medicare Other | Source: Ambulatory Visit | Attending: Orthopedic Surgery | Admitting: Orthopedic Surgery

## 2018-01-18 DIAGNOSIS — M25552 Pain in left hip: Secondary | ICD-10-CM | POA: Diagnosis not present

## 2018-01-18 DIAGNOSIS — Z96642 Presence of left artificial hip joint: Secondary | ICD-10-CM | POA: Diagnosis not present

## 2018-01-18 DIAGNOSIS — Z471 Aftercare following joint replacement surgery: Secondary | ICD-10-CM | POA: Diagnosis not present

## 2018-01-18 MED ORDER — TECHNETIUM TC 99M MEDRONATE IV KIT
20.0000 | PACK | Freq: Once | INTRAVENOUS | Status: AC | PRN
  Administered 2018-01-18: 20 via INTRAVENOUS

## 2018-01-24 DIAGNOSIS — M25552 Pain in left hip: Secondary | ICD-10-CM | POA: Diagnosis not present

## 2018-01-31 DIAGNOSIS — M25552 Pain in left hip: Secondary | ICD-10-CM | POA: Diagnosis not present

## 2018-02-02 DIAGNOSIS — M25552 Pain in left hip: Secondary | ICD-10-CM | POA: Diagnosis not present

## 2018-02-07 DIAGNOSIS — M25552 Pain in left hip: Secondary | ICD-10-CM | POA: Diagnosis not present

## 2018-02-09 DIAGNOSIS — M25552 Pain in left hip: Secondary | ICD-10-CM | POA: Diagnosis not present

## 2018-02-14 DIAGNOSIS — M25552 Pain in left hip: Secondary | ICD-10-CM | POA: Diagnosis not present

## 2018-02-16 DIAGNOSIS — M25552 Pain in left hip: Secondary | ICD-10-CM | POA: Diagnosis not present

## 2018-02-20 DIAGNOSIS — M179 Osteoarthritis of knee, unspecified: Secondary | ICD-10-CM | POA: Diagnosis not present

## 2018-02-20 DIAGNOSIS — G4700 Insomnia, unspecified: Secondary | ICD-10-CM | POA: Diagnosis not present

## 2018-02-20 DIAGNOSIS — E119 Type 2 diabetes mellitus without complications: Secondary | ICD-10-CM | POA: Diagnosis not present

## 2018-02-20 DIAGNOSIS — M76899 Other specified enthesopathies of unspecified lower limb, excluding foot: Secondary | ICD-10-CM | POA: Diagnosis not present

## 2018-02-20 DIAGNOSIS — Z79899 Other long term (current) drug therapy: Secondary | ICD-10-CM | POA: Diagnosis not present

## 2018-02-20 DIAGNOSIS — E559 Vitamin D deficiency, unspecified: Secondary | ICD-10-CM | POA: Diagnosis not present

## 2018-02-20 DIAGNOSIS — I10 Essential (primary) hypertension: Secondary | ICD-10-CM | POA: Diagnosis not present

## 2018-02-20 DIAGNOSIS — Z23 Encounter for immunization: Secondary | ICD-10-CM | POA: Diagnosis not present

## 2018-02-20 DIAGNOSIS — Z0000 Encounter for general adult medical examination without abnormal findings: Secondary | ICD-10-CM | POA: Diagnosis not present

## 2018-02-20 DIAGNOSIS — M109 Gout, unspecified: Secondary | ICD-10-CM | POA: Diagnosis not present

## 2018-02-20 DIAGNOSIS — F322 Major depressive disorder, single episode, severe without psychotic features: Secondary | ICD-10-CM | POA: Diagnosis not present

## 2018-02-21 DIAGNOSIS — M25552 Pain in left hip: Secondary | ICD-10-CM | POA: Diagnosis not present

## 2018-02-23 DIAGNOSIS — M25552 Pain in left hip: Secondary | ICD-10-CM | POA: Diagnosis not present

## 2018-03-02 DIAGNOSIS — M25552 Pain in left hip: Secondary | ICD-10-CM | POA: Diagnosis not present

## 2018-03-06 DIAGNOSIS — M25552 Pain in left hip: Secondary | ICD-10-CM | POA: Diagnosis not present

## 2018-03-08 DIAGNOSIS — M25552 Pain in left hip: Secondary | ICD-10-CM | POA: Diagnosis not present

## 2018-03-13 DIAGNOSIS — M25552 Pain in left hip: Secondary | ICD-10-CM | POA: Diagnosis not present

## 2018-03-16 DIAGNOSIS — M25552 Pain in left hip: Secondary | ICD-10-CM | POA: Diagnosis not present

## 2018-03-21 DIAGNOSIS — M25552 Pain in left hip: Secondary | ICD-10-CM | POA: Diagnosis not present

## 2018-03-22 DIAGNOSIS — M25552 Pain in left hip: Secondary | ICD-10-CM | POA: Diagnosis not present

## 2018-03-28 DIAGNOSIS — M25552 Pain in left hip: Secondary | ICD-10-CM | POA: Diagnosis not present

## 2018-03-30 DIAGNOSIS — M25552 Pain in left hip: Secondary | ICD-10-CM | POA: Diagnosis not present

## 2018-04-11 DIAGNOSIS — M25552 Pain in left hip: Secondary | ICD-10-CM | POA: Diagnosis not present

## 2018-05-30 DIAGNOSIS — H35363 Drusen (degenerative) of macula, bilateral: Secondary | ICD-10-CM | POA: Diagnosis not present

## 2018-05-30 DIAGNOSIS — H35033 Hypertensive retinopathy, bilateral: Secondary | ICD-10-CM | POA: Diagnosis not present

## 2018-05-30 DIAGNOSIS — H401133 Primary open-angle glaucoma, bilateral, severe stage: Secondary | ICD-10-CM | POA: Diagnosis not present

## 2018-05-30 DIAGNOSIS — H35371 Puckering of macula, right eye: Secondary | ICD-10-CM | POA: Diagnosis not present

## 2018-05-31 ENCOUNTER — Encounter (INDEPENDENT_AMBULATORY_CARE_PROVIDER_SITE_OTHER): Payer: Medicare Other | Admitting: Ophthalmology

## 2018-05-31 DIAGNOSIS — H353134 Nonexudative age-related macular degeneration, bilateral, advanced atrophic with subfoveal involvement: Secondary | ICD-10-CM

## 2018-05-31 DIAGNOSIS — E113392 Type 2 diabetes mellitus with moderate nonproliferative diabetic retinopathy without macular edema, left eye: Secondary | ICD-10-CM | POA: Diagnosis not present

## 2018-05-31 DIAGNOSIS — E11319 Type 2 diabetes mellitus with unspecified diabetic retinopathy without macular edema: Secondary | ICD-10-CM

## 2018-05-31 DIAGNOSIS — I10 Essential (primary) hypertension: Secondary | ICD-10-CM

## 2018-05-31 DIAGNOSIS — H35033 Hypertensive retinopathy, bilateral: Secondary | ICD-10-CM | POA: Diagnosis not present

## 2018-05-31 DIAGNOSIS — H43813 Vitreous degeneration, bilateral: Secondary | ICD-10-CM | POA: Diagnosis not present

## 2018-07-31 ENCOUNTER — Encounter (INDEPENDENT_AMBULATORY_CARE_PROVIDER_SITE_OTHER): Payer: Medicare Other | Admitting: Ophthalmology

## 2018-07-31 DIAGNOSIS — H43813 Vitreous degeneration, bilateral: Secondary | ICD-10-CM | POA: Diagnosis not present

## 2018-07-31 DIAGNOSIS — H3562 Retinal hemorrhage, left eye: Secondary | ICD-10-CM

## 2018-07-31 DIAGNOSIS — H353134 Nonexudative age-related macular degeneration, bilateral, advanced atrophic with subfoveal involvement: Secondary | ICD-10-CM | POA: Diagnosis not present

## 2018-07-31 DIAGNOSIS — H35033 Hypertensive retinopathy, bilateral: Secondary | ICD-10-CM

## 2018-07-31 DIAGNOSIS — I10 Essential (primary) hypertension: Secondary | ICD-10-CM

## 2018-08-04 DIAGNOSIS — H9312 Tinnitus, left ear: Secondary | ICD-10-CM | POA: Diagnosis not present

## 2018-09-19 DIAGNOSIS — E119 Type 2 diabetes mellitus without complications: Secondary | ICD-10-CM | POA: Diagnosis not present

## 2018-09-19 DIAGNOSIS — F419 Anxiety disorder, unspecified: Secondary | ICD-10-CM | POA: Diagnosis not present

## 2018-09-19 DIAGNOSIS — I10 Essential (primary) hypertension: Secondary | ICD-10-CM | POA: Diagnosis not present

## 2018-09-19 DIAGNOSIS — G4700 Insomnia, unspecified: Secondary | ICD-10-CM | POA: Diagnosis not present

## 2018-09-19 DIAGNOSIS — M109 Gout, unspecified: Secondary | ICD-10-CM | POA: Diagnosis not present

## 2018-09-19 DIAGNOSIS — H9312 Tinnitus, left ear: Secondary | ICD-10-CM | POA: Diagnosis not present

## 2018-09-19 DIAGNOSIS — Z79899 Other long term (current) drug therapy: Secondary | ICD-10-CM | POA: Diagnosis not present

## 2018-09-19 DIAGNOSIS — M179 Osteoarthritis of knee, unspecified: Secondary | ICD-10-CM | POA: Diagnosis not present

## 2018-09-19 DIAGNOSIS — M19049 Primary osteoarthritis, unspecified hand: Secondary | ICD-10-CM | POA: Diagnosis not present

## 2018-09-19 DIAGNOSIS — E559 Vitamin D deficiency, unspecified: Secondary | ICD-10-CM | POA: Diagnosis not present

## 2018-09-19 DIAGNOSIS — M76899 Other specified enthesopathies of unspecified lower limb, excluding foot: Secondary | ICD-10-CM | POA: Diagnosis not present

## 2018-09-19 DIAGNOSIS — F322 Major depressive disorder, single episode, severe without psychotic features: Secondary | ICD-10-CM | POA: Diagnosis not present

## 2018-12-19 DIAGNOSIS — H9312 Tinnitus, left ear: Secondary | ICD-10-CM | POA: Diagnosis not present

## 2018-12-19 DIAGNOSIS — G4700 Insomnia, unspecified: Secondary | ICD-10-CM | POA: Diagnosis not present

## 2018-12-19 DIAGNOSIS — F419 Anxiety disorder, unspecified: Secondary | ICD-10-CM | POA: Diagnosis not present

## 2018-12-19 DIAGNOSIS — E119 Type 2 diabetes mellitus without complications: Secondary | ICD-10-CM | POA: Diagnosis not present

## 2018-12-19 DIAGNOSIS — M109 Gout, unspecified: Secondary | ICD-10-CM | POA: Diagnosis not present

## 2018-12-19 DIAGNOSIS — F322 Major depressive disorder, single episode, severe without psychotic features: Secondary | ICD-10-CM | POA: Diagnosis not present

## 2018-12-19 DIAGNOSIS — E559 Vitamin D deficiency, unspecified: Secondary | ICD-10-CM | POA: Diagnosis not present

## 2018-12-19 DIAGNOSIS — Z79899 Other long term (current) drug therapy: Secondary | ICD-10-CM | POA: Diagnosis not present

## 2018-12-19 DIAGNOSIS — M179 Osteoarthritis of knee, unspecified: Secondary | ICD-10-CM | POA: Diagnosis not present

## 2018-12-19 DIAGNOSIS — I10 Essential (primary) hypertension: Secondary | ICD-10-CM | POA: Diagnosis not present

## 2019-01-31 ENCOUNTER — Encounter (INDEPENDENT_AMBULATORY_CARE_PROVIDER_SITE_OTHER): Payer: Medicare Other | Admitting: Ophthalmology

## 2019-01-31 ENCOUNTER — Other Ambulatory Visit: Payer: Self-pay

## 2019-01-31 DIAGNOSIS — H353134 Nonexudative age-related macular degeneration, bilateral, advanced atrophic with subfoveal involvement: Secondary | ICD-10-CM | POA: Diagnosis not present

## 2019-01-31 DIAGNOSIS — H35033 Hypertensive retinopathy, bilateral: Secondary | ICD-10-CM

## 2019-01-31 DIAGNOSIS — I10 Essential (primary) hypertension: Secondary | ICD-10-CM

## 2019-01-31 DIAGNOSIS — H43813 Vitreous degeneration, bilateral: Secondary | ICD-10-CM | POA: Diagnosis not present

## 2019-01-31 DIAGNOSIS — H26492 Other secondary cataract, left eye: Secondary | ICD-10-CM | POA: Diagnosis not present

## 2019-02-08 ENCOUNTER — Other Ambulatory Visit: Payer: Self-pay

## 2019-02-08 ENCOUNTER — Encounter (INDEPENDENT_AMBULATORY_CARE_PROVIDER_SITE_OTHER): Payer: Medicare Other | Admitting: Ophthalmology

## 2019-02-08 DIAGNOSIS — H2702 Aphakia, left eye: Secondary | ICD-10-CM

## 2019-02-14 DIAGNOSIS — Z23 Encounter for immunization: Secondary | ICD-10-CM | POA: Diagnosis not present

## 2019-03-27 DIAGNOSIS — Z79899 Other long term (current) drug therapy: Secondary | ICD-10-CM | POA: Diagnosis not present

## 2019-03-27 DIAGNOSIS — F419 Anxiety disorder, unspecified: Secondary | ICD-10-CM | POA: Diagnosis not present

## 2019-03-27 DIAGNOSIS — F331 Major depressive disorder, recurrent, moderate: Secondary | ICD-10-CM | POA: Diagnosis not present

## 2019-03-27 DIAGNOSIS — Z1389 Encounter for screening for other disorder: Secondary | ICD-10-CM | POA: Diagnosis not present

## 2019-03-27 DIAGNOSIS — M109 Gout, unspecified: Secondary | ICD-10-CM | POA: Diagnosis not present

## 2019-03-27 DIAGNOSIS — H9312 Tinnitus, left ear: Secondary | ICD-10-CM | POA: Diagnosis not present

## 2019-03-27 DIAGNOSIS — I10 Essential (primary) hypertension: Secondary | ICD-10-CM | POA: Diagnosis not present

## 2019-03-27 DIAGNOSIS — Z0000 Encounter for general adult medical examination without abnormal findings: Secondary | ICD-10-CM | POA: Diagnosis not present

## 2019-03-27 DIAGNOSIS — E559 Vitamin D deficiency, unspecified: Secondary | ICD-10-CM | POA: Diagnosis not present

## 2019-03-27 DIAGNOSIS — M7072 Other bursitis of hip, left hip: Secondary | ICD-10-CM | POA: Diagnosis not present

## 2019-03-27 DIAGNOSIS — E119 Type 2 diabetes mellitus without complications: Secondary | ICD-10-CM | POA: Diagnosis not present

## 2019-03-27 DIAGNOSIS — G4700 Insomnia, unspecified: Secondary | ICD-10-CM | POA: Diagnosis not present

## 2019-03-27 DIAGNOSIS — M179 Osteoarthritis of knee, unspecified: Secondary | ICD-10-CM | POA: Diagnosis not present

## 2019-10-09 DIAGNOSIS — Z79899 Other long term (current) drug therapy: Secondary | ICD-10-CM | POA: Diagnosis not present

## 2019-10-09 DIAGNOSIS — I10 Essential (primary) hypertension: Secondary | ICD-10-CM | POA: Diagnosis not present

## 2019-10-09 DIAGNOSIS — F331 Major depressive disorder, recurrent, moderate: Secondary | ICD-10-CM | POA: Diagnosis not present

## 2019-10-09 DIAGNOSIS — F419 Anxiety disorder, unspecified: Secondary | ICD-10-CM | POA: Diagnosis not present

## 2019-10-09 DIAGNOSIS — G4489 Other headache syndrome: Secondary | ICD-10-CM | POA: Diagnosis not present

## 2019-10-09 DIAGNOSIS — H9312 Tinnitus, left ear: Secondary | ICD-10-CM | POA: Diagnosis not present

## 2019-10-09 DIAGNOSIS — M179 Osteoarthritis of knee, unspecified: Secondary | ICD-10-CM | POA: Diagnosis not present

## 2019-10-09 DIAGNOSIS — G4700 Insomnia, unspecified: Secondary | ICD-10-CM | POA: Diagnosis not present

## 2019-10-09 DIAGNOSIS — M109 Gout, unspecified: Secondary | ICD-10-CM | POA: Diagnosis not present

## 2019-10-09 DIAGNOSIS — E119 Type 2 diabetes mellitus without complications: Secondary | ICD-10-CM | POA: Diagnosis not present

## 2019-10-09 DIAGNOSIS — E559 Vitamin D deficiency, unspecified: Secondary | ICD-10-CM | POA: Diagnosis not present

## 2019-10-26 ENCOUNTER — Other Ambulatory Visit: Payer: Self-pay | Admitting: Internal Medicine

## 2019-10-26 DIAGNOSIS — R110 Nausea: Secondary | ICD-10-CM

## 2019-10-26 DIAGNOSIS — G44209 Tension-type headache, unspecified, not intractable: Secondary | ICD-10-CM

## 2019-11-07 ENCOUNTER — Ambulatory Visit
Admission: RE | Admit: 2019-11-07 | Discharge: 2019-11-07 | Disposition: A | Discharge status: Home / Self Care | Payer: Medicare Other | Source: Ambulatory Visit | Attending: Internal Medicine | Admitting: Internal Medicine

## 2019-11-07 DIAGNOSIS — G44209 Tension-type headache, unspecified, not intractable: Secondary | ICD-10-CM

## 2019-11-07 DIAGNOSIS — R519 Headache, unspecified: Secondary | ICD-10-CM | POA: Diagnosis not present

## 2019-11-07 DIAGNOSIS — R110 Nausea: Secondary | ICD-10-CM

## 2019-11-29 DIAGNOSIS — I10 Essential (primary) hypertension: Secondary | ICD-10-CM | POA: Diagnosis not present

## 2019-11-29 DIAGNOSIS — F331 Major depressive disorder, recurrent, moderate: Secondary | ICD-10-CM | POA: Diagnosis not present

## 2019-11-29 DIAGNOSIS — E119 Type 2 diabetes mellitus without complications: Secondary | ICD-10-CM | POA: Diagnosis not present

## 2019-11-29 DIAGNOSIS — M179 Osteoarthritis of knee, unspecified: Secondary | ICD-10-CM | POA: Diagnosis not present

## 2019-11-29 DIAGNOSIS — F322 Major depressive disorder, single episode, severe without psychotic features: Secondary | ICD-10-CM | POA: Diagnosis not present

## 2019-11-29 DIAGNOSIS — F329 Major depressive disorder, single episode, unspecified: Secondary | ICD-10-CM | POA: Diagnosis not present

## 2019-11-29 DIAGNOSIS — M19049 Primary osteoarthritis, unspecified hand: Secondary | ICD-10-CM | POA: Diagnosis not present

## 2020-01-10 DIAGNOSIS — M179 Osteoarthritis of knee, unspecified: Secondary | ICD-10-CM | POA: Diagnosis not present

## 2020-01-10 DIAGNOSIS — M19049 Primary osteoarthritis, unspecified hand: Secondary | ICD-10-CM | POA: Diagnosis not present

## 2020-01-10 DIAGNOSIS — F331 Major depressive disorder, recurrent, moderate: Secondary | ICD-10-CM | POA: Diagnosis not present

## 2020-01-10 DIAGNOSIS — E119 Type 2 diabetes mellitus without complications: Secondary | ICD-10-CM | POA: Diagnosis not present

## 2020-01-10 DIAGNOSIS — F322 Major depressive disorder, single episode, severe without psychotic features: Secondary | ICD-10-CM | POA: Diagnosis not present

## 2020-01-10 DIAGNOSIS — I10 Essential (primary) hypertension: Secondary | ICD-10-CM | POA: Diagnosis not present

## 2020-01-10 DIAGNOSIS — F329 Major depressive disorder, single episode, unspecified: Secondary | ICD-10-CM | POA: Diagnosis not present

## 2020-02-11 ENCOUNTER — Other Ambulatory Visit: Payer: Self-pay

## 2020-02-11 ENCOUNTER — Encounter (INDEPENDENT_AMBULATORY_CARE_PROVIDER_SITE_OTHER): Payer: Medicare Other | Admitting: Ophthalmology

## 2020-02-11 DIAGNOSIS — H35033 Hypertensive retinopathy, bilateral: Secondary | ICD-10-CM | POA: Diagnosis not present

## 2020-02-11 DIAGNOSIS — H43813 Vitreous degeneration, bilateral: Secondary | ICD-10-CM

## 2020-02-11 DIAGNOSIS — H353134 Nonexudative age-related macular degeneration, bilateral, advanced atrophic with subfoveal involvement: Secondary | ICD-10-CM | POA: Diagnosis not present

## 2020-02-11 DIAGNOSIS — I10 Essential (primary) hypertension: Secondary | ICD-10-CM

## 2020-02-19 DIAGNOSIS — Z23 Encounter for immunization: Secondary | ICD-10-CM | POA: Diagnosis not present

## 2020-02-29 DIAGNOSIS — Z23 Encounter for immunization: Secondary | ICD-10-CM | POA: Diagnosis not present

## 2020-04-22 DIAGNOSIS — M179 Osteoarthritis of knee, unspecified: Secondary | ICD-10-CM | POA: Diagnosis not present

## 2020-04-22 DIAGNOSIS — M109 Gout, unspecified: Secondary | ICD-10-CM | POA: Diagnosis not present

## 2020-04-22 DIAGNOSIS — G4700 Insomnia, unspecified: Secondary | ICD-10-CM | POA: Diagnosis not present

## 2020-04-22 DIAGNOSIS — E119 Type 2 diabetes mellitus without complications: Secondary | ICD-10-CM | POA: Diagnosis not present

## 2020-04-22 DIAGNOSIS — F331 Major depressive disorder, recurrent, moderate: Secondary | ICD-10-CM | POA: Diagnosis not present

## 2020-04-22 DIAGNOSIS — E538 Deficiency of other specified B group vitamins: Secondary | ICD-10-CM | POA: Diagnosis not present

## 2020-04-22 DIAGNOSIS — Z0000 Encounter for general adult medical examination without abnormal findings: Secondary | ICD-10-CM | POA: Diagnosis not present

## 2020-04-22 DIAGNOSIS — F322 Major depressive disorder, single episode, severe without psychotic features: Secondary | ICD-10-CM | POA: Diagnosis not present

## 2020-04-22 DIAGNOSIS — I10 Essential (primary) hypertension: Secondary | ICD-10-CM | POA: Diagnosis not present

## 2020-04-22 DIAGNOSIS — F419 Anxiety disorder, unspecified: Secondary | ICD-10-CM | POA: Diagnosis not present

## 2020-04-22 DIAGNOSIS — H9312 Tinnitus, left ear: Secondary | ICD-10-CM | POA: Diagnosis not present

## 2020-04-22 DIAGNOSIS — E559 Vitamin D deficiency, unspecified: Secondary | ICD-10-CM | POA: Diagnosis not present

## 2020-04-22 DIAGNOSIS — G4489 Other headache syndrome: Secondary | ICD-10-CM | POA: Diagnosis not present

## 2020-04-22 DIAGNOSIS — Z79899 Other long term (current) drug therapy: Secondary | ICD-10-CM | POA: Diagnosis not present

## 2020-07-16 DIAGNOSIS — N9489 Other specified conditions associated with female genital organs and menstrual cycle: Secondary | ICD-10-CM | POA: Diagnosis not present

## 2020-07-16 DIAGNOSIS — R300 Dysuria: Secondary | ICD-10-CM | POA: Diagnosis not present

## 2020-07-21 DIAGNOSIS — F419 Anxiety disorder, unspecified: Secondary | ICD-10-CM | POA: Diagnosis not present

## 2020-07-21 DIAGNOSIS — G4489 Other headache syndrome: Secondary | ICD-10-CM | POA: Diagnosis not present

## 2020-07-21 DIAGNOSIS — M179 Osteoarthritis of knee, unspecified: Secondary | ICD-10-CM | POA: Diagnosis not present

## 2020-07-21 DIAGNOSIS — G4700 Insomnia, unspecified: Secondary | ICD-10-CM | POA: Diagnosis not present

## 2020-07-21 DIAGNOSIS — N9489 Other specified conditions associated with female genital organs and menstrual cycle: Secondary | ICD-10-CM | POA: Diagnosis not present

## 2020-07-21 DIAGNOSIS — R300 Dysuria: Secondary | ICD-10-CM | POA: Diagnosis not present

## 2020-10-15 DIAGNOSIS — F419 Anxiety disorder, unspecified: Secondary | ICD-10-CM | POA: Diagnosis not present

## 2020-10-15 DIAGNOSIS — F331 Major depressive disorder, recurrent, moderate: Secondary | ICD-10-CM | POA: Diagnosis not present

## 2020-10-15 DIAGNOSIS — G4489 Other headache syndrome: Secondary | ICD-10-CM | POA: Diagnosis not present

## 2020-10-15 DIAGNOSIS — E119 Type 2 diabetes mellitus without complications: Secondary | ICD-10-CM | POA: Diagnosis not present

## 2020-10-15 DIAGNOSIS — I10 Essential (primary) hypertension: Secondary | ICD-10-CM | POA: Diagnosis not present

## 2020-10-15 DIAGNOSIS — N9489 Other specified conditions associated with female genital organs and menstrual cycle: Secondary | ICD-10-CM | POA: Diagnosis not present

## 2020-10-15 DIAGNOSIS — R300 Dysuria: Secondary | ICD-10-CM | POA: Diagnosis not present

## 2020-10-15 DIAGNOSIS — E559 Vitamin D deficiency, unspecified: Secondary | ICD-10-CM | POA: Diagnosis not present

## 2020-10-15 DIAGNOSIS — Z7984 Long term (current) use of oral hypoglycemic drugs: Secondary | ICD-10-CM | POA: Diagnosis not present

## 2020-10-15 DIAGNOSIS — M109 Gout, unspecified: Secondary | ICD-10-CM | POA: Diagnosis not present

## 2020-10-15 DIAGNOSIS — H9312 Tinnitus, left ear: Secondary | ICD-10-CM | POA: Diagnosis not present

## 2020-10-15 DIAGNOSIS — M179 Osteoarthritis of knee, unspecified: Secondary | ICD-10-CM | POA: Diagnosis not present

## 2020-10-15 DIAGNOSIS — G4700 Insomnia, unspecified: Secondary | ICD-10-CM | POA: Diagnosis not present

## 2020-12-02 DIAGNOSIS — H43393 Other vitreous opacities, bilateral: Secondary | ICD-10-CM | POA: Diagnosis not present

## 2020-12-02 DIAGNOSIS — H35033 Hypertensive retinopathy, bilateral: Secondary | ICD-10-CM | POA: Diagnosis not present

## 2020-12-02 DIAGNOSIS — H401133 Primary open-angle glaucoma, bilateral, severe stage: Secondary | ICD-10-CM | POA: Diagnosis not present

## 2020-12-02 DIAGNOSIS — Z961 Presence of intraocular lens: Secondary | ICD-10-CM | POA: Diagnosis not present

## 2020-12-02 DIAGNOSIS — E119 Type 2 diabetes mellitus without complications: Secondary | ICD-10-CM | POA: Diagnosis not present

## 2020-12-02 DIAGNOSIS — H353130 Nonexudative age-related macular degeneration, bilateral, stage unspecified: Secondary | ICD-10-CM | POA: Diagnosis not present

## 2021-02-11 ENCOUNTER — Other Ambulatory Visit: Payer: Self-pay

## 2021-02-11 ENCOUNTER — Encounter (INDEPENDENT_AMBULATORY_CARE_PROVIDER_SITE_OTHER): Payer: Medicare Other | Admitting: Ophthalmology

## 2021-02-11 DIAGNOSIS — H353134 Nonexudative age-related macular degeneration, bilateral, advanced atrophic with subfoveal involvement: Secondary | ICD-10-CM

## 2021-02-11 DIAGNOSIS — H35033 Hypertensive retinopathy, bilateral: Secondary | ICD-10-CM | POA: Diagnosis not present

## 2021-02-11 DIAGNOSIS — I10 Essential (primary) hypertension: Secondary | ICD-10-CM

## 2021-02-11 DIAGNOSIS — H43813 Vitreous degeneration, bilateral: Secondary | ICD-10-CM | POA: Diagnosis not present

## 2021-03-03 ENCOUNTER — Emergency Department (HOSPITAL_COMMUNITY)
Admission: EM | Admit: 2021-03-03 | Discharge: 2021-03-03 | Disposition: A | Discharge status: Home / Self Care | Payer: Medicare Other | Attending: Student | Admitting: Student

## 2021-03-03 ENCOUNTER — Emergency Department (HOSPITAL_COMMUNITY): Payer: Medicare Other

## 2021-03-03 ENCOUNTER — Encounter (HOSPITAL_COMMUNITY): Payer: Self-pay | Admitting: Pharmacy Technician

## 2021-03-03 DIAGNOSIS — M7989 Other specified soft tissue disorders: Secondary | ICD-10-CM | POA: Diagnosis not present

## 2021-03-03 DIAGNOSIS — R519 Headache, unspecified: Secondary | ICD-10-CM | POA: Insufficient documentation

## 2021-03-03 DIAGNOSIS — W06XXXA Fall from bed, initial encounter: Secondary | ICD-10-CM | POA: Diagnosis not present

## 2021-03-03 DIAGNOSIS — R58 Hemorrhage, not elsewhere classified: Secondary | ICD-10-CM | POA: Diagnosis not present

## 2021-03-03 DIAGNOSIS — S0181XA Laceration without foreign body of other part of head, initial encounter: Secondary | ICD-10-CM | POA: Diagnosis not present

## 2021-03-03 DIAGNOSIS — S61411A Laceration without foreign body of right hand, initial encounter: Secondary | ICD-10-CM | POA: Diagnosis not present

## 2021-03-03 DIAGNOSIS — S0990XA Unspecified injury of head, initial encounter: Secondary | ICD-10-CM | POA: Diagnosis not present

## 2021-03-03 DIAGNOSIS — M79641 Pain in right hand: Secondary | ICD-10-CM | POA: Diagnosis not present

## 2021-03-03 DIAGNOSIS — S6991XA Unspecified injury of right wrist, hand and finger(s), initial encounter: Secondary | ICD-10-CM | POA: Diagnosis present

## 2021-03-03 DIAGNOSIS — Z79899 Other long term (current) drug therapy: Secondary | ICD-10-CM | POA: Insufficient documentation

## 2021-03-03 DIAGNOSIS — E119 Type 2 diabetes mellitus without complications: Secondary | ICD-10-CM | POA: Diagnosis not present

## 2021-03-03 DIAGNOSIS — S01119A Laceration without foreign body of unspecified eyelid and periocular area, initial encounter: Secondary | ICD-10-CM | POA: Insufficient documentation

## 2021-03-03 DIAGNOSIS — M25561 Pain in right knee: Secondary | ICD-10-CM | POA: Insufficient documentation

## 2021-03-03 DIAGNOSIS — M79671 Pain in right foot: Secondary | ICD-10-CM | POA: Diagnosis not present

## 2021-03-03 DIAGNOSIS — M25521 Pain in right elbow: Secondary | ICD-10-CM | POA: Diagnosis not present

## 2021-03-03 DIAGNOSIS — M25531 Pain in right wrist: Secondary | ICD-10-CM | POA: Diagnosis not present

## 2021-03-03 DIAGNOSIS — Z91040 Latex allergy status: Secondary | ICD-10-CM | POA: Insufficient documentation

## 2021-03-03 DIAGNOSIS — Z96642 Presence of left artificial hip joint: Secondary | ICD-10-CM | POA: Insufficient documentation

## 2021-03-03 DIAGNOSIS — M4602 Spinal enthesopathy, cervical region: Secondary | ICD-10-CM | POA: Diagnosis not present

## 2021-03-03 DIAGNOSIS — M5030 Other cervical disc degeneration, unspecified cervical region: Secondary | ICD-10-CM | POA: Diagnosis not present

## 2021-03-03 DIAGNOSIS — Z96651 Presence of right artificial knee joint: Secondary | ICD-10-CM | POA: Diagnosis not present

## 2021-03-03 DIAGNOSIS — I10 Essential (primary) hypertension: Secondary | ICD-10-CM | POA: Insufficient documentation

## 2021-03-03 DIAGNOSIS — S01111A Laceration without foreign body of right eyelid and periocular area, initial encounter: Secondary | ICD-10-CM | POA: Diagnosis not present

## 2021-03-03 DIAGNOSIS — M25511 Pain in right shoulder: Secondary | ICD-10-CM | POA: Insufficient documentation

## 2021-03-03 DIAGNOSIS — M79643 Pain in unspecified hand: Secondary | ICD-10-CM | POA: Diagnosis not present

## 2021-03-03 DIAGNOSIS — W19XXXA Unspecified fall, initial encounter: Secondary | ICD-10-CM | POA: Diagnosis not present

## 2021-03-03 DIAGNOSIS — M25551 Pain in right hip: Secondary | ICD-10-CM | POA: Insufficient documentation

## 2021-03-03 DIAGNOSIS — Z7982 Long term (current) use of aspirin: Secondary | ICD-10-CM | POA: Diagnosis not present

## 2021-03-03 DIAGNOSIS — S0993XA Unspecified injury of face, initial encounter: Secondary | ICD-10-CM | POA: Diagnosis not present

## 2021-03-03 DIAGNOSIS — S199XXA Unspecified injury of neck, initial encounter: Secondary | ICD-10-CM | POA: Diagnosis not present

## 2021-03-03 DIAGNOSIS — M4312 Spondylolisthesis, cervical region: Secondary | ICD-10-CM | POA: Diagnosis not present

## 2021-03-03 DIAGNOSIS — M25571 Pain in right ankle and joints of right foot: Secondary | ICD-10-CM | POA: Diagnosis not present

## 2021-03-03 DIAGNOSIS — Z043 Encounter for examination and observation following other accident: Secondary | ICD-10-CM | POA: Diagnosis not present

## 2021-03-03 MED ORDER — LIDOCAINE HCL (PF) 1 % IJ SOLN
10.0000 mL | Freq: Once | INTRAMUSCULAR | Status: AC
  Administered 2021-03-03: 10 mL via INTRADERMAL

## 2021-03-03 NOTE — ED Provider Notes (Signed)
[name] EMERGENCY DEPARTMENT Provider Note   CSN: [id number] Arrival date & time: 03/03/21  9735     History No chief complaint on file.   Haley Campbell is a 87 y.o. female with PMH T2DM, diverticulosis, HTN, otosclerosis, chronic tinnitus who presents the emergency department for evaluation of a fall.  Patient states that she has had 2 falls, 1 4 days ago and 1 today.  Both falls have been mechanical where she has tripped over something.  Today she arrives due to a hematoma over the face with 2 visible lacerations.  She states that she believes that she tried to walk too fast and fell forward, striking her head against the wall.  She is not on blood thinners, denies loss of consciousness or associated nausea, vomiting or confusion.  She arrives endorsing a headache, facial pain, right shoulder pain, right elbow pain, right wrist pain, right hip pain, right knee and R foot pain.  HPI     Past Medical History:  Diagnosis Date   Allergic rhinitis    Anxiety    Degenerative disc disease    knees   Depression    Diabetes mellitus (HCC)    type 2 - fasting cbg 100-130   Diverticulosis    with episodic diverticulitis   Dyspepsia    persistent   Gout    HA (headache)    chronic   History of blood transfusion    Hypertension    Hypertension    Insomnia    severe   Low back pain    Chronic   Otosclerosis    with left stapedectomy   PONV (postoperative nausea and vomiting)    Sciatica    left lower extremity    Tinnitus of both ears    Chronic severe, and mixed conductive and sensorineural hearing loss    Patient Active Problem List   Diagnosis Date Noted   Arthritis, hip 07/01/2014   Chest pain 04/11/2014   Hypertension    Dyspepsia    Diabetes mellitus (HCC)    CAP (community acquired pneumonia) 04/10/2014   Headache(784.0) 10/23/2013    Past Surgical History:  Procedure Laterality Date   APPENDECTOMY     BREAST REDUCTION SURGERY Bilateral     CHOLECYSTECTOMY     EYE SURGERY Bilateral    cataracts   INCISION AND DRAINAGE Right 12/28/2016   Procedure: INCISION AND DRAINAGE RIGHT LONG FINGER;  Surgeon: Betha Loa, MD;  Location: [facility];  Service: Orthopedics;  Laterality: Right;   STAPEDECTOMY Left    middle ear   TOTAL ABDOMINAL HYSTERECTOMY     TOTAL HIP ARTHROPLASTY Left 07/01/2014   Procedure: TOTAL HIP ARTHROPLASTY;  Surgeon: Nestor Lewandowsky, MD;  Location: [facility] OR;  Service: Orthopedics;  Laterality: Left;   TOTAL KNEE ARTHROPLASTY Right      OB History   No obstetric history on file.     Family History  Problem Relation Age of Onset   CVA Mother    CAD Father    Hypertension Brother    Prostate cancer Brother    Cancer Sister        BRAIN TUMOR    Social History   Tobacco Use   Smoking status: Never   Smokeless tobacco: Never  Substance Use Topics   Alcohol use: No   Drug use: No    Home Medications Prior to Admission medications   Medication Sig Start Date End Date Taking? Authorizing Provider  ALPRAZolam (  XANAX) 0.5 MG tablet Take 0.5 mg by mouth at bedtime as needed for anxiety.    [provider]  aspirin EC 325 MG tablet Take 1 tablet (325 mg total) by mouth 2 (two) times daily. Patient taking differently: Take 81 mg by mouth once.  07/01/14   Allena Katz, PA-C  cephALEXin (KEFLEX) 500 MG capsule Take 500 mg by mouth 2 (two) times daily.    [provider]  gabapentin (NEURONTIN) 300 MG capsule Take 1 capsule (300 mg total) by mouth 2 (two) times daily. Patient not taking: Reported on 06/18/2014 12/21/13   Ramond Marrow, DO  glucose blood test strip 1 each by Other route as needed for other. Use as instructed    [provider]  HYDROcodone-acetaminophen (NORCO) 5-325 MG tablet 1-2 tabs po q6 hours prn pain 12/28/16   Betha Loa, MD  ibuprofen (ADVIL,MOTRIN) 200 MG tablet Take 2 tablets (400 mg total) by mouth every 6 (six) hours as needed for fever  or mild pain. 04/11/14   Christiane Ha, MD  indomethacin (INDOCIN) 50 MG capsule Take 50 mg by mouth 2 (two) times daily with a meal.    [provider]  latanoprost (XALATAN) 0.005 % ophthalmic solution Place 1 drop into the right eye at bedtime.  03/27/12   [provider]  lisinopril (PRINIVIL,ZESTRIL) 10 MG tablet Take 10 mg by mouth daily.    [provider]  metFORMIN (GLUCOPHAGE) 500 MG tablet Take 500 mg by mouth 2 (two) times daily with a meal.    [provider]  oxyCODONE-acetaminophen (ROXICET) 5-325 MG per tablet Take 1 tablet by mouth every 4 (four) hours as needed. 07/01/14   Allena Katz, PA-C  tizanidine (ZANAFLEX) 2 MG capsule Take 1 capsule (2 mg total) by mouth 3 (three) times daily. 07/01/14   Allena Katz, PA-C  zolpidem (AMBIEN) 10 MG tablet Take 10 mg by mouth at bedtime as needed for sleep.    [provider]    Allergies    Latex, Codeine, Colchicine, Hctz [hydrochlorothiazide], Meperidine, Sonata [zaleplon], and Sulfa antibiotics  Review of Systems   Review of Systems  Constitutional:  Negative for chills and fever.  HENT:  Negative for ear pain and sore throat.   Eyes:  Negative for pain and visual disturbance.  Respiratory:  Negative for cough and shortness of breath.   Cardiovascular:  Negative for chest pain and palpitations.  Gastrointestinal:  Negative for abdominal pain and vomiting.  Genitourinary:  Negative for dysuria and hematuria.  Musculoskeletal:  Positive for arthralgias and myalgias. Negative for back pain.  Skin:  Positive for wound. Negative for color change and rash.  Neurological:  Positive for headaches. Negative for seizures and syncope.  All other systems reviewed and are negative.  Physical Exam Updated Vital Signs BP 129/78   Pulse 77   Temp 98.1 F (36.7 C)   Resp 16   SpO2 99%   Physical Exam Vitals and nursing note reviewed.  Constitutional:      General: She is not in  acute distress.    Appearance: She is well-developed.  HENT:     Head: Normocephalic and atraumatic.  Eyes:     Conjunctiva/sclera: Conjunctivae normal.  Cardiovascular:     Rate and Rhythm: Normal rate and regular rhythm.     Heart sounds: No murmur heard. Pulmonary:     Effort: Pulmonary effort is normal. No respiratory distress.     Breath sounds: Normal  breath sounds.  Abdominal:     Palpations: Abdomen is soft.     Tenderness: There is no abdominal tenderness.  Musculoskeletal:        General: Swelling (Right periorbital) and tenderness (Right shoulder, right elbow, right hand, right hip, right knee, right ankle) present.     Cervical back: Neck supple.  Skin:    General: Skin is warm and dry.     Findings: Lesion (5.5 centimeter forehead L-shaped laceration, 6 cm eyebrow laceration) present.  Neurological:     Mental Status: She is alert.    ED Results / Procedures / Treatments   Labs (all labs ordered are listed, but only abnormal results are displayed) Labs Reviewed - No data to display  EKG None  Radiology DG Chest 1 View  Result Date: 03/03/2021 CLINICAL DATA:  Fall from bed. EXAM: CHEST  1 VIEW COMPARISON:  06/21/2014 FINDINGS: Low volume chest with interstitial crowding. Chronic calcification in the left upper quadrant. Generous heart size accentuated by low volumes. No edema, effusion, or pneumothorax. No gross fracture. IMPRESSION: Low volume chest without acute or focal finding. Electronically Signed   By: Tiburcio Pea M.D.   On: 03/03/2021 09:48   DG Shoulder Right  Result Date: 03/03/2021 CLINICAL DATA:  Larey Seat out of bed this morning. EXAM: RIGHT SHOULDER - 2+ VIEW COMPARISON:  None. FINDINGS: The humeral head is normally located. No acute fracture is identified. The [facility] joint is intact. No clavicle or scapular fracture. The visualized ribs are intact. IMPRESSION: No acute bony findings. Electronically Signed   By: Rudie Meyer M.D.   On: 03/03/2021 09:41    DG Elbow 2 Views Right  Result Date: 03/03/2021 CLINICAL DATA:  Larey Seat out of bed this morning. Right elbow pain. EXAM: RIGHT ELBOW - 2 VIEW COMPARISON:  None. FINDINGS: The joint spaces are maintained. No significant degenerative changes. No acute fracture. No joint effusion. IMPRESSION: No acute bony findings. Electronically Signed   By: Rudie Meyer M.D.   On: 03/03/2021 09:50   DG Knee 2 Views Right  Result Date: 03/03/2021 CLINICAL DATA:  Larey Seat out of bed this morning. Right knee pain. EXAM: RIGHT KNEE - 1-2 VIEW COMPARISON:  None. FINDINGS: The total knee arthroplasty components are intact. No periprosthetic fracture. Possible small joint effusion. IMPRESSION: 1. No acute bony findings. 2. Possible small joint effusion. Electronically Signed   By: Rudie Meyer M.D.   On: 03/03/2021 09:44   DG Ankle Complete Right  Result Date: 03/03/2021 CLINICAL DATA:  Larey Seat out of bed this morning. Right ankle pain. EXAM: RIGHT ANKLE - COMPLETE 3+ VIEW COMPARISON:  None. FINDINGS: The ankle mortise is maintained. No acute ankle fracture. No osteochondral lesion. No definite joint effusion. The mid and hindfoot bony structures are intact. Advanced calcifications involving the distal Achilles tendon. IMPRESSION: No acute bony findings. Electronically Signed   By: Rudie Meyer M.D.   On: 03/03/2021 09:51   CT Head Wo Contrast  Result Date: 03/03/2021 CLINICAL DATA:  Head trauma, mod-severe fall, head trauma EXAM: CT HEAD WITHOUT CONTRAST TECHNIQUE: Contiguous axial images were obtained from the base of the skull through the vertex without intravenous contrast. COMPARISON:  11/07/2019 FINDINGS: Brain: Diffuse age related volume loss. No acute intracranial abnormality. Specifically, no hemorrhage, hydrocephalus, mass lesion, acute infarction, or significant intracranial injury. Vascular: No hyperdense vessel or unexpected calcification. Skull: No acute calvarial abnormality. Sinuses/Orbits: No acute findings  Other: Soft tissue swelling in the right forehead. IMPRESSION: No acute intracranial abnormality. Electronically  Signed   By: Charlett Nose M.D.   On: 03/03/2021 09:38   CT Cervical Spine Wo Contrast  Result Date: 03/03/2021 CLINICAL DATA:  Polytrauma, critical, head/C-spine injury suspected. Fall. EXAM: CT CERVICAL SPINE WITHOUT CONTRAST TECHNIQUE: Multidetector CT imaging of the cervical spine was performed without intravenous contrast. Multiplanar CT image reconstructions were also generated. COMPARISON:  None. FINDINGS: Alignment: Slight degenerative anterolisthesis of C4 on C5. Skull base and vertebrae: No acute fracture. No primary bone lesion or focal pathologic process. Soft tissues and spinal canal: No prevertebral fluid or swelling. No visible canal hematoma. Disc levels: Disc space narrowing and spurring at C5-6 and C6-7. Diffuse advanced degenerative facet disease. Upper chest: No acute findings Other: None IMPRESSION: Degenerative disc and facet disease.  No acute bony abnormality. Electronically Signed   By: Charlett Nose M.D.   On: 03/03/2021 09:51   DG Hand 2 View Right  Result Date: 03/03/2021 CLINICAL DATA:  Larey Seat out of bed this morning. Right hand pain. EXAM: RIGHT HAND - 2 VIEW COMPARISON:  None. FINDINGS: Changes of erosive OA are noted involving the interphalangeal joints. No acute hand or wrist fracture is identified. IMPRESSION: 1. No acute bony findings. 2. Changes of erosive OA involving the interphalangeal joints. Electronically Signed   By: Rudie Meyer M.D.   On: 03/03/2021 09:46   DG Foot 2 Views Right  Result Date: 03/03/2021 CLINICAL DATA:  Larey Seat out of bed this morning. Right foot pain. EXAM: RIGHT FOOT - 2 VIEW COMPARISON:  None. FINDINGS: Degenerative changes most notable at the first and fifth MTP joints with erosive changes suggesting gout. No acute foot fractures are identified. Dense calcification along the distal Achilles tendon. Vascular calcifications.  IMPRESSION: 1. No acute bony findings. 2. Degenerative changes and erosive changes at the first and fifth MTP joints suggesting gout. Electronically Signed   By: Rudie Meyer M.D.   On: 03/03/2021 09:49   DG Hip Unilat W or Wo Pelvis 2-3 Views Right  Result Date: 03/03/2021 CLINICAL DATA:  Larey Seat out of bed today. EXAM: DG HIP (WITH OR WITHOUT PELVIS) 2-3V RIGHT COMPARISON:  None. FINDINGS: The right hip is normally located. Moderate hip joint degenerative changes but no hip fracture or AVN. The left hip prosthesis is intact. The pubic symphysis and SI joints are intact. No definite pelvic fractures. IMPRESSION: No acute bony findings. Electronically Signed   By: Rudie Meyer M.D.   On: 03/03/2021 09:44   CT Maxillofacial Wo Contrast  Result Date: 03/03/2021 CLINICAL DATA:  Facial trauma.  Fall. EXAM: CT MAXILLOFACIAL WITHOUT CONTRAST TECHNIQUE: Multidetector CT imaging of the maxillofacial structures was performed. Multiplanar CT image reconstructions were also generated. COMPARISON:  None. FINDINGS: Osseous: No fracture or mandibular dislocation. No destructive process. Orbits: Soft tissue swelling over the right orbit. No fracture. Globes are intact. Sinuses: Clear Soft tissues: Soft tissue swelling over the right orbit and forehead. Limited intracranial: See head CT report IMPRESSION: No facial or orbital fracture. Electronically Signed   By: Charlett Nose M.D.   On: 03/03/2021 09:41    Procedures .Marland KitchenLaceration Repair  Date/Time: 03/03/2021 12:51 PM Performed by: Glendora Score, MD Authorized by: Glendora Score, MD   Laceration details:    Location:  Face   Face location:  Forehead   Length (cm):  5.5 Treatment:    Area cleansed with:  Saline   Amount of cleaning:  Standard   Irrigation solution:  Sterile saline   Debridement:  Minimal Skin repair:    Repair  method:  Sutures   Suture size:  6-0   Suture material:  Prolene   Suture technique:  Simple interrupted   Number of  sutures:  7 Approximation:    Approximation:  Close Repair type:    Repair type:  Simple Post-procedure details:    Dressing:  Antibiotic ointment and sterile dressing   Procedure completion:  Tolerated well, no immediate complications .Marland KitchenLaceration Repair  Date/Time: 03/03/2021 12:52 PM Performed by: Glendora Score, MD Authorized by: Glendora Score, MD   Laceration details:    Location:  Face   Face location:  R eyebrow   Length (cm):  6 Treatment:    Area cleansed with:  Saline   Amount of cleaning:  Standard   Irrigation solution:  Sterile saline   Debridement:  Minimal Skin repair:    Repair method:  Sutures and Steri-Strips   Suture size:  4-0   Suture material:  Prolene   Suture technique:  Simple interrupted   Number of sutures:  8   Number of Steri-Strips:  2 Approximation:    Approximation:  Close Repair type:    Repair type:  Simple Post-procedure details:    Dressing:  Antibiotic ointment and sterile dressing   Procedure completion:  Tolerated well, no immediate complications   Medications Ordered in ED Medications  lidocaine (PF) (XYLOCAINE) 1 % injection 10 mL (10 mLs Intradermal Given by Other 03/03/21 1138)    ED Course  I have reviewed the triage vital signs and the nursing notes.  Pertinent labs & imaging results that were available during my care of the patient were reviewed by me and considered in my medical decision making (see chart for details).    MDM Rules/Calculators/A&P                           Patient seen emergency department for evaluation of a fall with multiple facial lacerations.  Physical exam reveals a 5.5 cm L-shaped forehead laceration, 6 centimeter eyebrow laceration, 4 cm serrated hand laceration over the hypothenar eminence.  Physical exam also reveals tenderness to the right shoulder, elbow, hand, right hip, knee, and foot.  Trauma imaging including head CT, CT C-spine, x-rays of the above tender locations were  reassuringly negative for acute fracture, intracranial hemorrhage.  Lacerations repaired with Prolene nonabsorbable sutures.  7 in the hand, 7 for the forehead laceration and 8 for the eyebrow laceration.  Patient instructed to return to her primary care physician in 1 week for suture removal.  Patient then discharged.  This not required to be updated as it was updated 2 years ago per the patient's daughter. Final Clinical Impression(s) / ED Diagnoses Final diagnoses:  Fall, initial encounter  Facial laceration, initial encounter  Laceration of right hand, foreign body presence unspecified, initial encounter    Rx / DC Orders ED Discharge Orders     None        Durenda Pechacek, Wyn Forster, MD 03/03/21 1545

## 2021-03-03 NOTE — ED Notes (Signed)
Pt remains in imaging at this time.

## 2021-03-03 NOTE — ED Triage Notes (Signed)
Pt bib ems from home where pt lives alone. Pt tripped and fell this morning hitting her head on the wall. Has a  hematoma/laceration to R forehead. Pt fell on Sunday as well with some R hip pain. Small lac to R hand. Denies LOC. Alert and oriented X4. L pupil sluggish to react which pt states is normal due to her glaucoma. Bleeding controlled on arrival.  170/74 HR 88

## 2021-03-03 NOTE — ED Provider Notes (Signed)
87 year old female with multiple lacerations in the care of Dr. Audrie Lia.  I performed laceration repair of the right hand while Dr. Audrie Lia performed repairs of the face.  Marland Kitchen.Laceration Repair  Date/Time: 03/03/2021 12:24 PM Performed by: Bill Salinas, PA-C Authorized by: Bill Salinas, PA-C   Consent:    Consent obtained:  Verbal   Consent given by:  Patient   Risks, benefits, and alternatives were discussed: yes   Universal protocol:    Procedure explained and questions answered to patient or proxy's satisfaction: yes     Relevant documents present and verified: yes     Test results available: yes     Imaging studies available: yes     Required blood products, implants, devices, and special equipment available: yes     Site/side marked: yes     Immediately prior to procedure, a time out was called: yes     Patient identity confirmed:  Verbally with patient Anesthesia:    Anesthesia method:  Local infiltration   Local anesthetic:  Lidocaine 1% w/o epi Laceration details:    Location:  Hand   Hand location:  R palm   Length (cm):  4   Depth (mm):  5 Pre-procedure details:    Preparation:  Patient was prepped and draped in usual sterile fashion Exploration:    Hemostasis achieved with:  Direct pressure   Imaging obtained: x-ray     Imaging outcome: foreign body noted     Wound exploration: wound explored through full range of motion and entire depth of wound visualized     Wound extent: no foreign bodies/material noted, no muscle damage noted, no nerve damage noted, no tendon damage noted, no underlying fracture noted and no vascular damage noted   Treatment:    Area cleansed with:  Povidone-iodine   Amount of cleaning:  Standard   Irrigation solution:  Sterile saline Skin repair:    Repair method:  Sutures   Suture size:  4-0   Suture material:  Prolene   Suture technique:  Simple interrupted   Number of sutures:  7 Approximation:    Approximation:   Close Repair type:    Repair type:  Simple Post-procedure details:    Dressing:  Non-adherent dressing, sterile dressing and antibiotic ointment   Procedure completion:  Tolerated well, no immediate complications Comments:     Serrated-type laceration on ulnar-side of right palm. NVI and function intact post-procedure. Dressing by nursing staff.  Note: Portions of this report may have been transcribed using voice recognition software. Every effort was made to ensure accuracy; however, inadvertent computerized transcription errors may still be present.   Haley Campbell 03/03/21 1229    Glendora Score, MD 03/03/21 [phone]

## 2021-03-03 NOTE — ED Notes (Signed)
Patient Alert and oriented to baseline. Stable and ambulatory to baseline. Patient verbalized understanding of the discharge instructions.  Patient belongings were taken by the patient.   

## 2021-03-11 DIAGNOSIS — S61419A Laceration without foreign body of unspecified hand, initial encounter: Secondary | ICD-10-CM | POA: Diagnosis not present

## 2021-03-11 DIAGNOSIS — R030 Elevated blood-pressure reading, without diagnosis of hypertension: Secondary | ICD-10-CM | POA: Diagnosis not present

## 2021-03-11 DIAGNOSIS — S0181XA Laceration without foreign body of other part of head, initial encounter: Secondary | ICD-10-CM | POA: Diagnosis not present

## 2021-03-13 DIAGNOSIS — Z23 Encounter for immunization: Secondary | ICD-10-CM | POA: Diagnosis not present

## 2021-04-30 DIAGNOSIS — G4700 Insomnia, unspecified: Secondary | ICD-10-CM | POA: Diagnosis not present

## 2021-04-30 DIAGNOSIS — R030 Elevated blood-pressure reading, without diagnosis of hypertension: Secondary | ICD-10-CM | POA: Diagnosis not present

## 2021-04-30 DIAGNOSIS — G4489 Other headache syndrome: Secondary | ICD-10-CM | POA: Diagnosis not present

## 2021-04-30 DIAGNOSIS — E119 Type 2 diabetes mellitus without complications: Secondary | ICD-10-CM | POA: Diagnosis not present

## 2021-04-30 DIAGNOSIS — H9312 Tinnitus, left ear: Secondary | ICD-10-CM | POA: Diagnosis not present

## 2021-04-30 DIAGNOSIS — M179 Osteoarthritis of knee, unspecified: Secondary | ICD-10-CM | POA: Diagnosis not present

## 2021-04-30 DIAGNOSIS — I10 Essential (primary) hypertension: Secondary | ICD-10-CM | POA: Diagnosis not present

## 2021-04-30 DIAGNOSIS — F419 Anxiety disorder, unspecified: Secondary | ICD-10-CM | POA: Diagnosis not present

## 2021-04-30 DIAGNOSIS — Z0000 Encounter for general adult medical examination without abnormal findings: Secondary | ICD-10-CM | POA: Diagnosis not present

## 2021-04-30 DIAGNOSIS — N9489 Other specified conditions associated with female genital organs and menstrual cycle: Secondary | ICD-10-CM | POA: Diagnosis not present

## 2021-04-30 DIAGNOSIS — E1165 Type 2 diabetes mellitus with hyperglycemia: Secondary | ICD-10-CM | POA: Diagnosis not present

## 2021-04-30 DIAGNOSIS — M109 Gout, unspecified: Secondary | ICD-10-CM | POA: Diagnosis not present

## 2021-04-30 DIAGNOSIS — E559 Vitamin D deficiency, unspecified: Secondary | ICD-10-CM | POA: Diagnosis not present

## 2021-04-30 DIAGNOSIS — E538 Deficiency of other specified B group vitamins: Secondary | ICD-10-CM | POA: Diagnosis not present

## 2021-04-30 DIAGNOSIS — F331 Major depressive disorder, recurrent, moderate: Secondary | ICD-10-CM | POA: Diagnosis not present

## 2021-04-30 DIAGNOSIS — Z1389 Encounter for screening for other disorder: Secondary | ICD-10-CM | POA: Diagnosis not present

## 2021-04-30 DIAGNOSIS — Z7984 Long term (current) use of oral hypoglycemic drugs: Secondary | ICD-10-CM | POA: Diagnosis not present

## 2021-04-30 DIAGNOSIS — R300 Dysuria: Secondary | ICD-10-CM | POA: Diagnosis not present

## 2021-05-21 DIAGNOSIS — I10 Essential (primary) hypertension: Secondary | ICD-10-CM | POA: Diagnosis not present

## 2021-07-06 DIAGNOSIS — H35033 Hypertensive retinopathy, bilateral: Secondary | ICD-10-CM | POA: Diagnosis not present

## 2021-07-06 DIAGNOSIS — E119 Type 2 diabetes mellitus without complications: Secondary | ICD-10-CM | POA: Diagnosis not present

## 2021-07-06 DIAGNOSIS — H43393 Other vitreous opacities, bilateral: Secondary | ICD-10-CM | POA: Diagnosis not present

## 2021-07-06 DIAGNOSIS — H401133 Primary open-angle glaucoma, bilateral, severe stage: Secondary | ICD-10-CM | POA: Diagnosis not present

## 2021-11-02 DIAGNOSIS — M109 Gout, unspecified: Secondary | ICD-10-CM | POA: Diagnosis not present

## 2021-11-02 DIAGNOSIS — F419 Anxiety disorder, unspecified: Secondary | ICD-10-CM | POA: Diagnosis not present

## 2021-11-02 DIAGNOSIS — E538 Deficiency of other specified B group vitamins: Secondary | ICD-10-CM | POA: Diagnosis not present

## 2021-11-02 DIAGNOSIS — E119 Type 2 diabetes mellitus without complications: Secondary | ICD-10-CM | POA: Diagnosis not present

## 2021-11-02 DIAGNOSIS — M179 Osteoarthritis of knee, unspecified: Secondary | ICD-10-CM | POA: Diagnosis not present

## 2021-11-02 DIAGNOSIS — F331 Major depressive disorder, recurrent, moderate: Secondary | ICD-10-CM | POA: Diagnosis not present

## 2021-11-02 DIAGNOSIS — H9312 Tinnitus, left ear: Secondary | ICD-10-CM | POA: Diagnosis not present

## 2021-11-02 DIAGNOSIS — R030 Elevated blood-pressure reading, without diagnosis of hypertension: Secondary | ICD-10-CM | POA: Diagnosis not present

## 2021-11-02 DIAGNOSIS — I10 Essential (primary) hypertension: Secondary | ICD-10-CM | POA: Diagnosis not present

## 2021-11-02 DIAGNOSIS — G4489 Other headache syndrome: Secondary | ICD-10-CM | POA: Diagnosis not present

## 2021-11-02 DIAGNOSIS — E559 Vitamin D deficiency, unspecified: Secondary | ICD-10-CM | POA: Diagnosis not present

## 2021-11-02 DIAGNOSIS — G4700 Insomnia, unspecified: Secondary | ICD-10-CM | POA: Diagnosis not present

## 2022-01-05 DIAGNOSIS — H401133 Primary open-angle glaucoma, bilateral, severe stage: Secondary | ICD-10-CM | POA: Diagnosis not present

## 2022-01-05 DIAGNOSIS — Z961 Presence of intraocular lens: Secondary | ICD-10-CM | POA: Diagnosis not present

## 2022-01-05 DIAGNOSIS — H35033 Hypertensive retinopathy, bilateral: Secondary | ICD-10-CM | POA: Diagnosis not present

## 2022-01-05 DIAGNOSIS — H43393 Other vitreous opacities, bilateral: Secondary | ICD-10-CM | POA: Diagnosis not present

## 2022-01-05 DIAGNOSIS — H353131 Nonexudative age-related macular degeneration, bilateral, early dry stage: Secondary | ICD-10-CM | POA: Diagnosis not present

## 2022-01-05 DIAGNOSIS — E119 Type 2 diabetes mellitus without complications: Secondary | ICD-10-CM | POA: Diagnosis not present

## 2022-02-11 ENCOUNTER — Encounter (INDEPENDENT_AMBULATORY_CARE_PROVIDER_SITE_OTHER): Payer: Medicare Other | Admitting: Ophthalmology

## 2022-02-11 DIAGNOSIS — H35033 Hypertensive retinopathy, bilateral: Secondary | ICD-10-CM

## 2022-02-11 DIAGNOSIS — H353134 Nonexudative age-related macular degeneration, bilateral, advanced atrophic with subfoveal involvement: Secondary | ICD-10-CM

## 2022-02-11 DIAGNOSIS — I10 Essential (primary) hypertension: Secondary | ICD-10-CM

## 2022-02-11 DIAGNOSIS — H43813 Vitreous degeneration, bilateral: Secondary | ICD-10-CM | POA: Diagnosis not present

## 2022-03-12 DIAGNOSIS — Z23 Encounter for immunization: Secondary | ICD-10-CM | POA: Diagnosis not present

## 2022-03-18 DIAGNOSIS — I10 Essential (primary) hypertension: Secondary | ICD-10-CM | POA: Diagnosis not present

## 2022-03-18 DIAGNOSIS — H6121 Impacted cerumen, right ear: Secondary | ICD-10-CM | POA: Diagnosis not present

## 2022-03-20 DIAGNOSIS — I10 Essential (primary) hypertension: Secondary | ICD-10-CM | POA: Diagnosis not present

## 2022-03-20 DIAGNOSIS — S00411A Abrasion of right ear, initial encounter: Secondary | ICD-10-CM | POA: Diagnosis not present

## 2022-03-23 DIAGNOSIS — R42 Dizziness and giddiness: Secondary | ICD-10-CM | POA: Diagnosis not present

## 2022-03-23 DIAGNOSIS — H9313 Tinnitus, bilateral: Secondary | ICD-10-CM | POA: Diagnosis not present

## 2022-03-23 DIAGNOSIS — R54 Age-related physical debility: Secondary | ICD-10-CM | POA: Diagnosis not present

## 2022-03-31 DIAGNOSIS — N179 Acute kidney failure, unspecified: Secondary | ICD-10-CM | POA: Diagnosis not present

## 2022-05-10 DIAGNOSIS — H9312 Tinnitus, left ear: Secondary | ICD-10-CM | POA: Diagnosis not present

## 2022-05-10 DIAGNOSIS — Z1331 Encounter for screening for depression: Secondary | ICD-10-CM | POA: Diagnosis not present

## 2022-05-10 DIAGNOSIS — G4700 Insomnia, unspecified: Secondary | ICD-10-CM | POA: Diagnosis not present

## 2022-05-10 DIAGNOSIS — M109 Gout, unspecified: Secondary | ICD-10-CM | POA: Diagnosis not present

## 2022-05-10 DIAGNOSIS — M179 Osteoarthritis of knee, unspecified: Secondary | ICD-10-CM | POA: Diagnosis not present

## 2022-05-10 DIAGNOSIS — Z0000 Encounter for general adult medical examination without abnormal findings: Secondary | ICD-10-CM | POA: Diagnosis not present

## 2022-05-10 DIAGNOSIS — E119 Type 2 diabetes mellitus without complications: Secondary | ICD-10-CM | POA: Diagnosis not present

## 2022-05-10 DIAGNOSIS — I10 Essential (primary) hypertension: Secondary | ICD-10-CM | POA: Diagnosis not present

## 2022-05-10 DIAGNOSIS — D649 Anemia, unspecified: Secondary | ICD-10-CM | POA: Diagnosis not present

## 2022-05-10 DIAGNOSIS — F419 Anxiety disorder, unspecified: Secondary | ICD-10-CM | POA: Diagnosis not present

## 2022-05-10 DIAGNOSIS — E559 Vitamin D deficiency, unspecified: Secondary | ICD-10-CM | POA: Diagnosis not present

## 2022-05-10 DIAGNOSIS — E538 Deficiency of other specified B group vitamins: Secondary | ICD-10-CM | POA: Diagnosis not present

## 2022-06-22 ENCOUNTER — Ambulatory Visit
Admission: RE | Admit: 2022-06-22 | Discharge: 2022-06-22 | Disposition: A | Discharge status: Home / Self Care | Payer: Medicare Other | Source: Ambulatory Visit | Attending: Internal Medicine | Admitting: Internal Medicine

## 2022-06-22 ENCOUNTER — Other Ambulatory Visit: Payer: Self-pay | Admitting: Internal Medicine

## 2022-06-22 DIAGNOSIS — R079 Chest pain, unspecified: Secondary | ICD-10-CM

## 2022-06-22 DIAGNOSIS — N649 Disorder of breast, unspecified: Secondary | ICD-10-CM | POA: Diagnosis not present

## 2022-06-22 DIAGNOSIS — R0600 Dyspnea, unspecified: Secondary | ICD-10-CM | POA: Diagnosis not present

## 2022-07-13 DIAGNOSIS — H353131 Nonexudative age-related macular degeneration, bilateral, early dry stage: Secondary | ICD-10-CM | POA: Diagnosis not present

## 2022-07-13 DIAGNOSIS — H04123 Dry eye syndrome of bilateral lacrimal glands: Secondary | ICD-10-CM | POA: Diagnosis not present

## 2022-07-13 DIAGNOSIS — H401133 Primary open-angle glaucoma, bilateral, severe stage: Secondary | ICD-10-CM | POA: Diagnosis not present

## 2022-07-13 DIAGNOSIS — E119 Type 2 diabetes mellitus without complications: Secondary | ICD-10-CM | POA: Diagnosis not present

## 2022-07-13 DIAGNOSIS — H35033 Hypertensive retinopathy, bilateral: Secondary | ICD-10-CM | POA: Diagnosis not present

## 2022-07-13 DIAGNOSIS — Z961 Presence of intraocular lens: Secondary | ICD-10-CM | POA: Diagnosis not present

## 2022-07-13 DIAGNOSIS — H43393 Other vitreous opacities, bilateral: Secondary | ICD-10-CM | POA: Diagnosis not present

## 2022-09-09 DIAGNOSIS — M1611 Unilateral primary osteoarthritis, right hip: Secondary | ICD-10-CM | POA: Diagnosis not present

## 2022-09-09 DIAGNOSIS — M25561 Pain in right knee: Secondary | ICD-10-CM | POA: Diagnosis not present

## 2022-09-17 DIAGNOSIS — Z96642 Presence of left artificial hip joint: Secondary | ICD-10-CM | POA: Diagnosis not present

## 2022-09-17 DIAGNOSIS — I10 Essential (primary) hypertension: Secondary | ICD-10-CM | POA: Diagnosis not present

## 2022-09-17 DIAGNOSIS — Z9181 History of falling: Secondary | ICD-10-CM | POA: Diagnosis not present

## 2022-09-17 DIAGNOSIS — S76111D Strain of right quadriceps muscle, fascia and tendon, subsequent encounter: Secondary | ICD-10-CM | POA: Diagnosis not present

## 2022-09-17 DIAGNOSIS — Z96651 Presence of right artificial knee joint: Secondary | ICD-10-CM | POA: Diagnosis not present

## 2022-09-17 DIAGNOSIS — M1611 Unilateral primary osteoarthritis, right hip: Secondary | ICD-10-CM | POA: Diagnosis not present

## 2022-09-21 DIAGNOSIS — Z96642 Presence of left artificial hip joint: Secondary | ICD-10-CM | POA: Diagnosis not present

## 2022-09-21 DIAGNOSIS — Z96651 Presence of right artificial knee joint: Secondary | ICD-10-CM | POA: Diagnosis not present

## 2022-09-21 DIAGNOSIS — S76111D Strain of right quadriceps muscle, fascia and tendon, subsequent encounter: Secondary | ICD-10-CM | POA: Diagnosis not present

## 2022-09-21 DIAGNOSIS — M1611 Unilateral primary osteoarthritis, right hip: Secondary | ICD-10-CM | POA: Diagnosis not present

## 2022-09-21 DIAGNOSIS — Z9181 History of falling: Secondary | ICD-10-CM | POA: Diagnosis not present

## 2022-09-21 DIAGNOSIS — I10 Essential (primary) hypertension: Secondary | ICD-10-CM | POA: Diagnosis not present

## 2022-09-22 DIAGNOSIS — Z96642 Presence of left artificial hip joint: Secondary | ICD-10-CM | POA: Diagnosis not present

## 2022-09-22 DIAGNOSIS — I10 Essential (primary) hypertension: Secondary | ICD-10-CM | POA: Diagnosis not present

## 2022-09-22 DIAGNOSIS — Z9181 History of falling: Secondary | ICD-10-CM | POA: Diagnosis not present

## 2022-09-22 DIAGNOSIS — Z96651 Presence of right artificial knee joint: Secondary | ICD-10-CM | POA: Diagnosis not present

## 2022-09-22 DIAGNOSIS — M1611 Unilateral primary osteoarthritis, right hip: Secondary | ICD-10-CM | POA: Diagnosis not present

## 2022-09-22 DIAGNOSIS — S76111D Strain of right quadriceps muscle, fascia and tendon, subsequent encounter: Secondary | ICD-10-CM | POA: Diagnosis not present

## 2022-09-24 DIAGNOSIS — Z9181 History of falling: Secondary | ICD-10-CM | POA: Diagnosis not present

## 2022-09-24 DIAGNOSIS — S76111D Strain of right quadriceps muscle, fascia and tendon, subsequent encounter: Secondary | ICD-10-CM | POA: Diagnosis not present

## 2022-09-24 DIAGNOSIS — Z96651 Presence of right artificial knee joint: Secondary | ICD-10-CM | POA: Diagnosis not present

## 2022-09-24 DIAGNOSIS — Z96642 Presence of left artificial hip joint: Secondary | ICD-10-CM | POA: Diagnosis not present

## 2022-09-24 DIAGNOSIS — M1611 Unilateral primary osteoarthritis, right hip: Secondary | ICD-10-CM | POA: Diagnosis not present

## 2022-09-24 DIAGNOSIS — I10 Essential (primary) hypertension: Secondary | ICD-10-CM | POA: Diagnosis not present

## 2022-09-29 DIAGNOSIS — I10 Essential (primary) hypertension: Secondary | ICD-10-CM | POA: Diagnosis not present

## 2022-09-29 DIAGNOSIS — Z9181 History of falling: Secondary | ICD-10-CM | POA: Diagnosis not present

## 2022-09-29 DIAGNOSIS — Z96642 Presence of left artificial hip joint: Secondary | ICD-10-CM | POA: Diagnosis not present

## 2022-09-29 DIAGNOSIS — M1611 Unilateral primary osteoarthritis, right hip: Secondary | ICD-10-CM | POA: Diagnosis not present

## 2022-09-29 DIAGNOSIS — Z96651 Presence of right artificial knee joint: Secondary | ICD-10-CM | POA: Diagnosis not present

## 2022-09-29 DIAGNOSIS — S76111D Strain of right quadriceps muscle, fascia and tendon, subsequent encounter: Secondary | ICD-10-CM | POA: Diagnosis not present

## 2022-10-01 DIAGNOSIS — Z96642 Presence of left artificial hip joint: Secondary | ICD-10-CM | POA: Diagnosis not present

## 2022-10-01 DIAGNOSIS — Z96651 Presence of right artificial knee joint: Secondary | ICD-10-CM | POA: Diagnosis not present

## 2022-10-01 DIAGNOSIS — S76111D Strain of right quadriceps muscle, fascia and tendon, subsequent encounter: Secondary | ICD-10-CM | POA: Diagnosis not present

## 2022-10-01 DIAGNOSIS — Z9181 History of falling: Secondary | ICD-10-CM | POA: Diagnosis not present

## 2022-10-01 DIAGNOSIS — M1611 Unilateral primary osteoarthritis, right hip: Secondary | ICD-10-CM | POA: Diagnosis not present

## 2022-10-01 DIAGNOSIS — I10 Essential (primary) hypertension: Secondary | ICD-10-CM | POA: Diagnosis not present

## 2022-10-08 DIAGNOSIS — Z96642 Presence of left artificial hip joint: Secondary | ICD-10-CM | POA: Diagnosis not present

## 2022-10-08 DIAGNOSIS — I10 Essential (primary) hypertension: Secondary | ICD-10-CM | POA: Diagnosis not present

## 2022-10-08 DIAGNOSIS — S76111D Strain of right quadriceps muscle, fascia and tendon, subsequent encounter: Secondary | ICD-10-CM | POA: Diagnosis not present

## 2022-10-08 DIAGNOSIS — Z9181 History of falling: Secondary | ICD-10-CM | POA: Diagnosis not present

## 2022-10-08 DIAGNOSIS — Z96651 Presence of right artificial knee joint: Secondary | ICD-10-CM | POA: Diagnosis not present

## 2022-10-08 DIAGNOSIS — M1611 Unilateral primary osteoarthritis, right hip: Secondary | ICD-10-CM | POA: Diagnosis not present

## 2022-10-13 DIAGNOSIS — Z9181 History of falling: Secondary | ICD-10-CM | POA: Diagnosis not present

## 2022-10-13 DIAGNOSIS — S76111D Strain of right quadriceps muscle, fascia and tendon, subsequent encounter: Secondary | ICD-10-CM | POA: Diagnosis not present

## 2022-10-13 DIAGNOSIS — Z96642 Presence of left artificial hip joint: Secondary | ICD-10-CM | POA: Diagnosis not present

## 2022-10-13 DIAGNOSIS — Z96651 Presence of right artificial knee joint: Secondary | ICD-10-CM | POA: Diagnosis not present

## 2022-10-13 DIAGNOSIS — M1611 Unilateral primary osteoarthritis, right hip: Secondary | ICD-10-CM | POA: Diagnosis not present

## 2022-10-13 DIAGNOSIS — I10 Essential (primary) hypertension: Secondary | ICD-10-CM | POA: Diagnosis not present

## 2022-10-17 DIAGNOSIS — Z96651 Presence of right artificial knee joint: Secondary | ICD-10-CM | POA: Diagnosis not present

## 2022-10-17 DIAGNOSIS — Z96642 Presence of left artificial hip joint: Secondary | ICD-10-CM | POA: Diagnosis not present

## 2022-10-17 DIAGNOSIS — S76111D Strain of right quadriceps muscle, fascia and tendon, subsequent encounter: Secondary | ICD-10-CM | POA: Diagnosis not present

## 2022-10-17 DIAGNOSIS — M1611 Unilateral primary osteoarthritis, right hip: Secondary | ICD-10-CM | POA: Diagnosis not present

## 2022-10-17 DIAGNOSIS — I10 Essential (primary) hypertension: Secondary | ICD-10-CM | POA: Diagnosis not present

## 2022-10-17 DIAGNOSIS — Z9181 History of falling: Secondary | ICD-10-CM | POA: Diagnosis not present

## 2022-10-19 DIAGNOSIS — I10 Essential (primary) hypertension: Secondary | ICD-10-CM | POA: Diagnosis not present

## 2022-10-19 DIAGNOSIS — Z9181 History of falling: Secondary | ICD-10-CM | POA: Diagnosis not present

## 2022-10-19 DIAGNOSIS — S76111D Strain of right quadriceps muscle, fascia and tendon, subsequent encounter: Secondary | ICD-10-CM | POA: Diagnosis not present

## 2022-10-19 DIAGNOSIS — Z96651 Presence of right artificial knee joint: Secondary | ICD-10-CM | POA: Diagnosis not present

## 2022-10-19 DIAGNOSIS — M1611 Unilateral primary osteoarthritis, right hip: Secondary | ICD-10-CM | POA: Diagnosis not present

## 2022-10-19 DIAGNOSIS — Z96642 Presence of left artificial hip joint: Secondary | ICD-10-CM | POA: Diagnosis not present

## 2022-10-27 DIAGNOSIS — Z9181 History of falling: Secondary | ICD-10-CM | POA: Diagnosis not present

## 2022-10-27 DIAGNOSIS — Z96642 Presence of left artificial hip joint: Secondary | ICD-10-CM | POA: Diagnosis not present

## 2022-10-27 DIAGNOSIS — I10 Essential (primary) hypertension: Secondary | ICD-10-CM | POA: Diagnosis not present

## 2022-10-27 DIAGNOSIS — S76111D Strain of right quadriceps muscle, fascia and tendon, subsequent encounter: Secondary | ICD-10-CM | POA: Diagnosis not present

## 2022-10-27 DIAGNOSIS — Z96651 Presence of right artificial knee joint: Secondary | ICD-10-CM | POA: Diagnosis not present

## 2022-10-27 DIAGNOSIS — M1611 Unilateral primary osteoarthritis, right hip: Secondary | ICD-10-CM | POA: Diagnosis not present

## 2022-11-03 DIAGNOSIS — S76111D Strain of right quadriceps muscle, fascia and tendon, subsequent encounter: Secondary | ICD-10-CM | POA: Diagnosis not present

## 2022-11-03 DIAGNOSIS — Z96642 Presence of left artificial hip joint: Secondary | ICD-10-CM | POA: Diagnosis not present

## 2022-11-03 DIAGNOSIS — M1611 Unilateral primary osteoarthritis, right hip: Secondary | ICD-10-CM | POA: Diagnosis not present

## 2022-11-03 DIAGNOSIS — I10 Essential (primary) hypertension: Secondary | ICD-10-CM | POA: Diagnosis not present

## 2022-11-03 DIAGNOSIS — Z96651 Presence of right artificial knee joint: Secondary | ICD-10-CM | POA: Diagnosis not present

## 2022-11-03 DIAGNOSIS — Z9181 History of falling: Secondary | ICD-10-CM | POA: Diagnosis not present

## 2022-11-12 DIAGNOSIS — I10 Essential (primary) hypertension: Secondary | ICD-10-CM | POA: Diagnosis not present

## 2022-11-12 DIAGNOSIS — M1611 Unilateral primary osteoarthritis, right hip: Secondary | ICD-10-CM | POA: Diagnosis not present

## 2022-11-12 DIAGNOSIS — Z96651 Presence of right artificial knee joint: Secondary | ICD-10-CM | POA: Diagnosis not present

## 2022-11-12 DIAGNOSIS — Z9181 History of falling: Secondary | ICD-10-CM | POA: Diagnosis not present

## 2022-11-12 DIAGNOSIS — Z96642 Presence of left artificial hip joint: Secondary | ICD-10-CM | POA: Diagnosis not present

## 2022-11-12 DIAGNOSIS — S76111D Strain of right quadriceps muscle, fascia and tendon, subsequent encounter: Secondary | ICD-10-CM | POA: Diagnosis not present

## 2022-11-15 DIAGNOSIS — E119 Type 2 diabetes mellitus without complications: Secondary | ICD-10-CM | POA: Diagnosis not present

## 2022-11-15 DIAGNOSIS — G4700 Insomnia, unspecified: Secondary | ICD-10-CM | POA: Diagnosis not present

## 2022-11-15 DIAGNOSIS — I10 Essential (primary) hypertension: Secondary | ICD-10-CM | POA: Diagnosis not present

## 2023-01-17 DIAGNOSIS — H35033 Hypertensive retinopathy, bilateral: Secondary | ICD-10-CM | POA: Diagnosis not present

## 2023-01-17 DIAGNOSIS — H401133 Primary open-angle glaucoma, bilateral, severe stage: Secondary | ICD-10-CM | POA: Diagnosis not present

## 2023-01-17 DIAGNOSIS — H353131 Nonexudative age-related macular degeneration, bilateral, early dry stage: Secondary | ICD-10-CM | POA: Diagnosis not present

## 2023-01-17 DIAGNOSIS — E119 Type 2 diabetes mellitus without complications: Secondary | ICD-10-CM | POA: Diagnosis not present

## 2023-01-17 DIAGNOSIS — H43393 Other vitreous opacities, bilateral: Secondary | ICD-10-CM | POA: Diagnosis not present

## 2023-01-17 DIAGNOSIS — Z961 Presence of intraocular lens: Secondary | ICD-10-CM | POA: Diagnosis not present

## 2023-01-17 DIAGNOSIS — H04123 Dry eye syndrome of bilateral lacrimal glands: Secondary | ICD-10-CM | POA: Diagnosis not present

## 2023-02-17 DIAGNOSIS — E119 Type 2 diabetes mellitus without complications: Secondary | ICD-10-CM | POA: Diagnosis not present

## 2023-02-25 ENCOUNTER — Emergency Department (HOSPITAL_COMMUNITY): Payer: Medicare Other

## 2023-02-25 ENCOUNTER — Inpatient Hospital Stay (HOSPITAL_COMMUNITY): Payer: Medicare Other

## 2023-02-25 ENCOUNTER — Encounter (HOSPITAL_COMMUNITY): Payer: Self-pay

## 2023-02-25 ENCOUNTER — Other Ambulatory Visit: Payer: Self-pay

## 2023-02-25 ENCOUNTER — Inpatient Hospital Stay (HOSPITAL_COMMUNITY)
Admission: EM | Admit: 2023-02-25 | Discharge: 2023-02-27 | DRG: 389 | Disposition: A | Discharge status: Home / Self Care | Payer: Medicare Other | Attending: Internal Medicine | Admitting: Internal Medicine

## 2023-02-25 DIAGNOSIS — Z823 Family history of stroke: Secondary | ICD-10-CM | POA: Diagnosis not present

## 2023-02-25 DIAGNOSIS — Z882 Allergy status to sulfonamides status: Secondary | ICD-10-CM | POA: Diagnosis not present

## 2023-02-25 DIAGNOSIS — F5104 Psychophysiologic insomnia: Secondary | ICD-10-CM | POA: Diagnosis present

## 2023-02-25 DIAGNOSIS — F419 Anxiety disorder, unspecified: Secondary | ICD-10-CM | POA: Diagnosis present

## 2023-02-25 DIAGNOSIS — N1832 Chronic kidney disease, stage 3b: Secondary | ICD-10-CM | POA: Diagnosis not present

## 2023-02-25 DIAGNOSIS — K56609 Unspecified intestinal obstruction, unspecified as to partial versus complete obstruction: Secondary | ICD-10-CM | POA: Diagnosis present

## 2023-02-25 DIAGNOSIS — I10 Essential (primary) hypertension: Secondary | ICD-10-CM | POA: Diagnosis not present

## 2023-02-25 DIAGNOSIS — K5669 Other partial intestinal obstruction: Secondary | ICD-10-CM | POA: Diagnosis not present

## 2023-02-25 DIAGNOSIS — Z66 Do not resuscitate: Secondary | ICD-10-CM | POA: Diagnosis present

## 2023-02-25 DIAGNOSIS — Z79899 Other long term (current) drug therapy: Secondary | ICD-10-CM | POA: Diagnosis not present

## 2023-02-25 DIAGNOSIS — I129 Hypertensive chronic kidney disease with stage 1 through stage 4 chronic kidney disease, or unspecified chronic kidney disease: Secondary | ICD-10-CM | POA: Diagnosis present

## 2023-02-25 DIAGNOSIS — Z885 Allergy status to narcotic agent status: Secondary | ICD-10-CM

## 2023-02-25 DIAGNOSIS — R5381 Other malaise: Secondary | ICD-10-CM | POA: Diagnosis present

## 2023-02-25 DIAGNOSIS — Z4682 Encounter for fitting and adjustment of non-vascular catheter: Secondary | ICD-10-CM | POA: Diagnosis not present

## 2023-02-25 DIAGNOSIS — K449 Diaphragmatic hernia without obstruction or gangrene: Secondary | ICD-10-CM | POA: Diagnosis not present

## 2023-02-25 DIAGNOSIS — Z888 Allergy status to other drugs, medicaments and biological substances status: Secondary | ICD-10-CM

## 2023-02-25 DIAGNOSIS — Z96642 Presence of left artificial hip joint: Secondary | ICD-10-CM | POA: Diagnosis present

## 2023-02-25 DIAGNOSIS — Z91040 Latex allergy status: Secondary | ICD-10-CM | POA: Diagnosis not present

## 2023-02-25 DIAGNOSIS — Z96651 Presence of right artificial knee joint: Secondary | ICD-10-CM | POA: Diagnosis present

## 2023-02-25 DIAGNOSIS — Z8249 Family history of ischemic heart disease and other diseases of the circulatory system: Secondary | ICD-10-CM

## 2023-02-25 DIAGNOSIS — K429 Umbilical hernia without obstruction or gangrene: Secondary | ICD-10-CM | POA: Diagnosis not present

## 2023-02-25 DIAGNOSIS — N390 Urinary tract infection, site not specified: Secondary | ICD-10-CM | POA: Diagnosis present

## 2023-02-25 DIAGNOSIS — E1122 Type 2 diabetes mellitus with diabetic chronic kidney disease: Secondary | ICD-10-CM | POA: Diagnosis not present

## 2023-02-25 DIAGNOSIS — M109 Gout, unspecified: Secondary | ICD-10-CM | POA: Diagnosis not present

## 2023-02-25 DIAGNOSIS — K5730 Diverticulosis of large intestine without perforation or abscess without bleeding: Secondary | ICD-10-CM | POA: Diagnosis not present

## 2023-02-25 DIAGNOSIS — Z808 Family history of malignant neoplasm of other organs or systems: Secondary | ICD-10-CM

## 2023-02-25 DIAGNOSIS — R9389 Abnormal findings on diagnostic imaging of other specified body structures: Secondary | ICD-10-CM | POA: Diagnosis not present

## 2023-02-25 LAB — CBC
HCT: 43.2 % (ref 36.0–46.0)
Hemoglobin: 13.7 g/dL (ref 12.0–15.0)
MCH: 27.5 pg (ref 26.0–34.0)
MCHC: 31.7 g/dL (ref 30.0–36.0)
MCV: 86.6 fL (ref 80.0–100.0)
Platelets: 209 10*3/uL (ref 150–400)
RBC: 4.99 MIL/uL (ref 3.87–5.11)
RDW: 14.9 % (ref 11.5–15.5)
WBC: 17.6 10*3/uL — ABNORMAL HIGH (ref 4.0–10.5)
nRBC: 0 % (ref 0.0–0.2)

## 2023-02-25 LAB — URINALYSIS, ROUTINE W REFLEX MICROSCOPIC
Bilirubin Urine: NEGATIVE
Glucose, UA: NEGATIVE mg/dL
Hgb urine dipstick: NEGATIVE
Ketones, ur: NEGATIVE mg/dL
Nitrite: NEGATIVE
Protein, ur: 100 mg/dL — AB
Specific Gravity, Urine: 1.013 (ref 1.005–1.030)
pH: 6 (ref 5.0–8.0)

## 2023-02-25 LAB — COMPREHENSIVE METABOLIC PANEL
ALT: 13 U/L (ref 0–44)
AST: 31 U/L (ref 15–41)
Albumin: 4 g/dL (ref 3.5–5.0)
Alkaline Phosphatase: 76 U/L (ref 38–126)
Anion gap: 13 (ref 5–15)
BUN: 30 mg/dL — ABNORMAL HIGH (ref 8–23)
CO2: 23 mmol/L (ref 22–32)
Calcium: 10.2 mg/dL (ref 8.9–10.3)
Chloride: 101 mmol/L (ref 98–111)
Creatinine, Ser: 1.86 mg/dL — ABNORMAL HIGH (ref 0.44–1.00)
GFR, Estimated: 24 mL/min — ABNORMAL LOW (ref >60)
Glucose, Bld: 182 mg/dL — ABNORMAL HIGH (ref 70–99)
Potassium: 5.7 mmol/L — ABNORMAL HIGH (ref 3.5–5.1)
Sodium: 137 mmol/L (ref 135–145)
Total Bilirubin: 1.1 mg/dL (ref 0.3–1.2)
Total Protein: 7.8 g/dL (ref 6.5–8.1)

## 2023-02-25 LAB — I-STAT CG4 LACTIC ACID, ED: Lactic Acid, Venous: 1.5 mmol/L (ref 0.5–1.9)

## 2023-02-25 LAB — BASIC METABOLIC PANEL
Anion gap: 12 (ref 5–15)
BUN: 30 mg/dL — ABNORMAL HIGH (ref 8–23)
CO2: 23 mmol/L (ref 22–32)
Calcium: 10.2 mg/dL (ref 8.9–10.3)
Chloride: 102 mmol/L (ref 98–111)
Creatinine, Ser: 1.73 mg/dL — ABNORMAL HIGH (ref 0.44–1.00)
GFR, Estimated: 27 mL/min — ABNORMAL LOW (ref >60)
Glucose, Bld: 175 mg/dL — ABNORMAL HIGH (ref 70–99)
Potassium: 5 mmol/L (ref 3.5–5.1)
Sodium: 137 mmol/L (ref 135–145)

## 2023-02-25 LAB — LIPASE, BLOOD: Lipase: 56 U/L — ABNORMAL HIGH (ref 11–51)

## 2023-02-25 MED ORDER — ONDANSETRON 4 MG PO TBDP
4.0000 mg | ORAL_TABLET | Freq: Once | ORAL | Status: AC
  Administered 2023-02-25: 4 mg via ORAL
  Filled 2023-02-25: qty 1

## 2023-02-25 MED ORDER — OXYCODONE HCL 5 MG PO TABS
5.0000 mg | ORAL_TABLET | ORAL | Status: AC
  Administered 2023-02-25: 5 mg via ORAL
  Filled 2023-02-25: qty 1

## 2023-02-25 MED ORDER — DIATRIZOATE MEGLUMINE & SODIUM 66-10 % PO SOLN
90.0000 mL | Freq: Once | ORAL | Status: AC
  Administered 2023-02-26: 90 mL via NASOGASTRIC
  Filled 2023-02-25: qty 90

## 2023-02-25 MED ORDER — LACTATED RINGERS IV BOLUS
500.0000 mL | Freq: Once | INTRAVENOUS | Status: AC
  Administered 2023-02-25: 500 mL via INTRAVENOUS

## 2023-02-25 MED ORDER — LORAZEPAM 2 MG/ML IJ SOLN
0.5000 mg | Freq: Once | INTRAMUSCULAR | Status: AC | PRN
  Administered 2023-02-25: 0.5 mg via INTRAVENOUS
  Filled 2023-02-25: qty 1

## 2023-02-25 MED ORDER — ONDANSETRON HCL 4 MG/2ML IJ SOLN: 4.0000 mg | Freq: Once | INTRAMUSCULAR | Status: DC

## 2023-02-25 MED ORDER — HYDROMORPHONE HCL 1 MG/ML IJ SOLN: 0.2500 mg | Freq: Once | INTRAMUSCULAR | Status: DC

## 2023-02-25 NOTE — ED Notes (Addendum)
Awaiting official Xray results for NG tube placement.  Gastrografin not given at current time; will be administered once placement confirmed.  This RN notified Margo Aye DO of pt's request for something to help with discomfort.  Per Margo Aye DO, will order something for pt.

## 2023-02-25 NOTE — ED Triage Notes (Addendum)
Pt c/o int RLQ, RUQ abdominal pain and nausea started today. Pt is crying in pain in triage.

## 2023-02-25 NOTE — ED Notes (Addendum)
Transport arranged for pt to be taken to floor by ED charge RN prior to this RN having chance to update floor.  Pt still pending official Xray results for NG tube placement.  This RN attempted to call floor to notify of pending results.

## 2023-02-25 NOTE — ED Provider Notes (Signed)
[place] EMERGENCY DEPARTMENT AT [facility] Provider Note   CSN: [id number] Arrival date & time: 02/25/23  1459     History  Chief Complaint  Patient presents with   Abdominal Pain    Haley Campbell is a 89 y.o. female.  HPI 89 year old female presents with acute abdominal pain.  Is primarily right-sided.  Symptoms started at 5 AM and woke her up.  Has been severe ever since it started but it does seem to wax and wane throughout the day without any clear cause.  Can only describe it as a bad pain.  She was crying in triage though after being given oxycodone and Zofran she is feeling better.  She has had nausea without vomiting.  Feels like she has had some increased gas but only minimal stool output today.  Had a normal BM yesterday.  No chest pain, shortness of breath, back pain or urine symptoms.  Has had multiple prior surgeries which include her gallbladder being removed, appendectomy, and hysterectomy.  Home Medications Prior to Admission medications   Medication Sig Start Date End Date Taking? Authorizing Provider  allopurinol (ZYLOPRIM) 100 MG tablet Take 100 mg by mouth daily. 01/29/23  Yes [provider]  ALPRAZolam Prudy Feeler) 0.5 MG tablet Take 2 tablets by mouth at bedtime.   Yes [provider]  amLODipine (NORVASC) 5 MG tablet Take 5 mg by mouth daily. 12/22/22  Yes [provider]  Cholecalciferol (VITAMIN D3 EXTRA STRENGTH PO) Take 1 tablet by mouth daily.   Yes [provider]  traMADol (ULTRAM) 50 MG tablet Take 50 mg by mouth 3 (three) times daily as needed for moderate pain.   Yes [provider]  zolpidem (AMBIEN) 10 MG tablet Take 10 mg by mouth at bedtime as needed for sleep.   Yes [provider]  glucose blood test strip 1 each by Other route as needed for other. Use as instructed    [provider]  ibuprofen (ADVIL,MOTRIN) 200 MG tablet Take 2 tablets (400 mg total) by mouth every 6 (six)  hours as needed for fever or mild pain. Patient not taking: Reported on 02/25/2023 04/11/14   Christiane Ha, MD      Allergies    Latex, Codeine, Colchicine, Hctz [hydrochlorothiazide], Meperidine, Sonata [zaleplon], and Sulfa antibiotics    Review of Systems   Review of Systems  Constitutional:  Negative for fever.  Respiratory:  Negative for shortness of breath.   Cardiovascular:  Negative for chest pain.  Gastrointestinal:  Positive for abdominal distention (feels distended to her.), abdominal pain and nausea. Negative for diarrhea and vomiting.  Genitourinary:  Negative for dysuria.  Musculoskeletal:  Negative for back pain.    Physical Exam Updated Vital Signs BP (!) 151/83 (BP Location: Right Arm)   Pulse 88   Temp 98.1 F (36.7 C) (Oral)   Resp 19   Ht 5' 2.5" (1.588 m)   Wt 68.2 kg   SpO2 94%   BMI 27.06 kg/m  Physical Exam Vitals and nursing note reviewed.  Constitutional:      Appearance: She is well-developed.  HENT:     Head: Normocephalic and atraumatic.  Cardiovascular:     Rate and Rhythm: Normal rate and regular rhythm.     Heart sounds: Normal heart sounds.  Pulmonary:     Effort: Pulmonary effort is normal.     Breath sounds: Normal breath sounds.  Abdominal:     Palpations: Abdomen is soft.  Tenderness: There is abdominal tenderness in the right upper quadrant and right lower quadrant.  Skin:    General: Skin is warm and dry.  Neurological:     Mental Status: She is alert.     ED Results / Procedures / Treatments   Labs (all labs ordered are listed, but only abnormal results are displayed) Labs Reviewed  LIPASE, BLOOD - Abnormal; Notable for the following components:      Result Value   Lipase 56 (*)    All other components within normal limits  COMPREHENSIVE METABOLIC PANEL - Abnormal; Notable for the following components:   Potassium 5.7 (*)    Glucose, Bld 182 (*)    BUN 30 (*)    Creatinine, Ser 1.86 (*)    GFR, Estimated  24 (*)    All other components within normal limits  CBC - Abnormal; Notable for the following components:   WBC 17.6 (*)    All other components within normal limits  URINALYSIS, ROUTINE W REFLEX MICROSCOPIC - Abnormal; Notable for the following components:   Protein, ur 100 (*)    Leukocytes,Ua TRACE (*)    Bacteria, UA RARE (*)    All other components within normal limits  BASIC METABOLIC PANEL - Abnormal; Notable for the following components:   Glucose, Bld 175 (*)    BUN 30 (*)    Creatinine, Ser 1.73 (*)    GFR, Estimated 27 (*)    All other components within normal limits  I-STAT CG4 LACTIC ACID, ED    EKG EKG Interpretation Date/Time:  Friday February 25 2023 17:07:44 EDT Ventricular Rate:  89 PR Interval:  233 QRS Duration:  85 QT Interval:  378 QTC Calculation: 460 R Axis:   -43  Text Interpretation: Sinus rhythm Prolonged PR interval Abnormal R-wave progression, late transition Probable left ventricular hypertrophy Inferior infarct, old Confirmed by Pricilla Loveless [phone]) on 02/25/2023 5:25:54 PM  Radiology CT ABDOMEN PELVIS WO CONTRAST  Result Date: 02/25/2023 CLINICAL DATA:  Right lower quadrant pain and nausea beginning today. EXAM: CT ABDOMEN AND PELVIS WITHOUT CONTRAST TECHNIQUE: Multidetector CT imaging of the abdomen and pelvis was performed following the standard protocol without IV contrast. RADIATION DOSE REDUCTION: This exam was performed according to the departmental dose-optimization program which includes automated exposure control, adjustment of the mA and/or kV according to patient size and/or use of iterative reconstruction technique. COMPARISON:  None Available. FINDINGS: Lower chest: No acute findings. Hepatobiliary: No mass visualized on this unenhanced exam. Minimal perihepatic ascites noted. Prior cholecystectomy. No evidence of biliary obstruction. Pancreas: No mass or inflammatory process visualized on this unenhanced exam. Spleen: Within normal  limits in size. Benign calcified granuloma or cyst seen in the anterior spleen. Adrenals/Urinary tract: No evidence of urolithiasis or hydronephrosis. Unremarkable unopacified urinary bladder. Stomach/Bowel: Tiny hiatal hernia noted. Diffuse colonic diverticulosis is seen, without signs of diverticulitis. Although the appendix is not directly visualized, no inflammatory process seen in region of the cecum or elsewhere. Mildly dilated small bowel loops with air-fluid levels are seen within the abdomen and pelvis, with small bowel feces sign and transition point seen in the right lateral abdomen. No obstructing mass or inflammatory process identified, and this raises suspicion for an adhesion. Vascular/Lymphatic: No pathologically enlarged lymph nodes identified. No evidence of abdominal aortic aneurysm. Reproductive:  No mass or other significant abnormality. Other:  Tiny umbilical hernia, which contains only fat. Musculoskeletal: No suspicious bone lesions identified. Left hip prosthesis noted. IMPRESSION: Distal small bowel obstruction, with  transition point in the right lateral abdomen suspicious for an adhesion. Colonic diverticulosis, without radiographic evidence of diverticulitis. Minimal perihepatic ascites. Tiny hiatal hernia. Tiny umbilical hernia, which contains only fat. Electronically Signed   By: Danae Orleans M.D.   On: 02/25/2023 18:40    Procedures Procedures    Medications Ordered in ED Medications  diatrizoate meglumine-sodium (GASTROGRAFIN) 66-10 % solution 90 mL (has no administration in time range)  oxyCODONE (Oxy IR/ROXICODONE) immediate release tablet 5 mg (5 mg Oral Given 02/25/23 1521)  ondansetron (ZOFRAN-ODT) disintegrating tablet 4 mg (4 mg Oral Given 02/25/23 1521)  lactated ringers bolus 500 mL (0 mLs Intravenous Stopped 02/25/23 1852)  LORazepam (ATIVAN) injection 0.5 mg (0.5 mg Intravenous Given 02/25/23 2028)    ED Course/ Medical Decision Making/ A&P                                  Medical Decision Making Amount and/or Complexity of Data Reviewed Labs: ordered.    Details: Leukocytosis. Normal lactate Radiology: ordered and independent interpretation performed.    Details: SBO  Risk Prescription drug management. Decision regarding hospitalization.   Patient is found to have SBO.  No vomiting here.  Discussed with Dr. Bedelia Person, who request NG tube and small bowel protocol.  Patient's pain seems to be controlled since getting a dose of oxycodone in the waiting room.  Given some gentle fluids.  Will admit to the hospitalist service, discussed with Dr. Margo Aye.        Final Clinical Impression(s) / ED Diagnoses Final diagnoses:  Small bowel obstruction [facility])    Rx / DC Orders ED Discharge Orders     None         Pricilla Loveless, MD 02/25/23 2126

## 2023-02-25 NOTE — ED Notes (Signed)
ED TO INPATIENT HANDOFF REPORT  ED Nurse Name and Phone #: Joselyn Glassman [phone])  S Name/Age/Gender Haley Campbell 89 y.o. female Room/Bed: 032C/032C  Code Status   Code Status: Prior  Home/SNF/Other Home Patient oriented to: self, place, time, and situation Is this baseline? Yes   Triage Complete: Triage complete  Chief Complaint SBO (small bowel obstruction) (HCC) [K56.609]  Triage Note Pt c/o int RLQ, RUQ abdominal pain and nausea started today. Pt is crying in pain in triage.   Allergies Allergies  Allergen Reactions   Latex Other (See Comments)    Severe mucosal allergy   Codeine Nausea Only   Colchicine Diarrhea and Nausea And Vomiting   Hctz [Hydrochlorothiazide] Nausea Only and Other (See Comments)    Fever and Headache   Meperidine     Other reaction(s): RASH   Sonata [Zaleplon] Nausea Only and Other (See Comments)    No sleep   Sulfa Antibiotics Rash    Level of Care/Admitting Diagnosis ED Disposition     ED Disposition  Admit   Condition  --   Comment  Hospital Area: [facility] [[id number]]  Level of Care: Telemetry Surgical [[id number]]  May admit patient to [facility] or [facility] if equivalent level of care is available:: Yes  Covid Evaluation: Asymptomatic - no recent exposure (last 10 days) testing not required  Diagnosis: SBO (small bowel obstruction) [facility] [[id number]]  Admitting Physician: Darlin Drop [[id number]]  Attending Physician: Darlin Drop [[id number]]  Certification:: I certify this patient will need inpatient services for at least 2 midnights  Expected Medical Readiness: 02/28/2023          B Medical/Surgery History Past Medical History:  Diagnosis Date   Allergic rhinitis    Anxiety    Degenerative disc disease    knees   Depression    Diabetes mellitus (HCC)    type 2 - fasting cbg 100-130   Diverticulosis    with episodic diverticulitis   Dyspepsia    persistent   Gout    HA (headache)    chronic   History  of blood transfusion    Hypertension    Hypertension    Insomnia    severe   Low back pain    Chronic   Otosclerosis    with left stapedectomy   PONV (postoperative nausea and vomiting)    Sciatica    left lower extremity    Tinnitus of both ears    Chronic severe, and mixed conductive and sensorineural hearing loss   Past Surgical History:  Procedure Laterality Date   APPENDECTOMY     BREAST REDUCTION SURGERY Bilateral    CHOLECYSTECTOMY     EYE SURGERY Bilateral    cataracts   INCISION AND DRAINAGE Right 12/28/2016   Procedure: INCISION AND DRAINAGE RIGHT LONG FINGER;  Surgeon: Betha Loa, MD;  Location: [facility];  Service: Orthopedics;  Laterality: Right;   STAPEDECTOMY Left    middle ear   TOTAL ABDOMINAL HYSTERECTOMY     TOTAL HIP ARTHROPLASTY Left 07/01/2014   Procedure: TOTAL HIP ARTHROPLASTY;  Surgeon: Nestor Lewandowsky, MD;  Location: [facility] OR;  Service: Orthopedics;  Laterality: Left;   TOTAL KNEE ARTHROPLASTY Right      A IV Location/Drains/Wounds Patient Lines/Drains/Airways Status     Active Line/Drains/Airways     Name Placement date Placement time Site Days   Peripheral IV 02/25/23 22 G Anterior;Right Forearm 02/25/23  1740  Forearm  less than 1  Intake/Output Last 24 hours  Intake/Output Summary (Last 24 hours) at 02/25/2023 2022 Last data filed at 02/25/2023 6045 Gross per 24 hour  Intake 500 ml  Output --  Net 500 ml    Labs/Imaging Results for orders placed or performed during the hospital encounter of 02/25/23 (from the past 48 hour(s))  Lipase, blood     Status: Abnormal   Collection Time: 02/25/23  3:21 PM  Result Value Ref Range   Lipase 56 (H) 11 - 51 U/L    Comment: HEMOLYSIS AT THIS LEVEL MAY AFFECT RESULT Performed at [facility], [address] [place], [place] [address]   Comprehensive metabolic panel     Status: Abnormal   Collection Time: 02/25/23  3:21 PM  Result Value Ref Range   Sodium 137  135 - 145 mmol/L   Potassium 5.7 (H) 3.5 - 5.1 mmol/L    Comment: HEMOLYSIS AT THIS LEVEL MAY AFFECT RESULT   Chloride 101 98 - 111 mmol/L   CO2 23 22 - 32 mmol/L   Glucose, Bld 182 (H) 70 - 99 mg/dL    Comment: Glucose reference range applies only to samples taken after fasting for at least 8 hours.   BUN 30 (H) 8 - 23 mg/dL   Creatinine, Ser 1.91 (H) 0.44 - 1.00 mg/dL   Calcium 47.8 8.9 - 29.5 mg/dL   Total Protein 7.8 6.5 - 8.1 g/dL   Albumin 4.0 3.5 - 5.0 g/dL   AST 31 15 - 41 U/L    Comment: HEMOLYSIS AT THIS LEVEL MAY AFFECT RESULT   ALT 13 0 - 44 U/L    Comment: HEMOLYSIS AT THIS LEVEL MAY AFFECT RESULT   Alkaline Phosphatase 76 38 - 126 U/L    Comment: HEMOLYSIS AT THIS LEVEL MAY AFFECT RESULT   Total Bilirubin 1.1 0.3 - 1.2 mg/dL    Comment: HEMOLYSIS AT THIS LEVEL MAY AFFECT RESULT   GFR, Estimated 24 (L) >60 mL/min    Comment: (NOTE) Calculated using the CKD-EPI Creatinine Equation (2021)    Anion gap 13 5 - 15    Comment: Performed at [facility], [address] [place], [place] [address]  CBC     Status: Abnormal   Collection Time: 02/25/23  3:21 PM  Result Value Ref Range   WBC 17.6 (H) 4.0 - 10.5 K/uL   RBC 4.99 3.87 - 5.11 MIL/uL   Hemoglobin 13.7 12.0 - 15.0 g/dL   HCT 86.5 78.4 - 69.6 %   MCV 86.6 80.0 - 100.0 fL   MCH 27.5 26.0 - 34.0 pg   MCHC 31.7 30.0 - 36.0 g/dL   RDW 29.5 28.4 - 13.2 %   Platelets 209 150 - 400 K/uL   nRBC 0.0 0.0 - 0.2 %    Comment: Performed at [facility], [address] [place], [place] [address]  Urinalysis, Routine w reflex microscopic -Urine, Clean Catch     Status: Abnormal   Collection Time: 02/25/23  3:21 PM  Result Value Ref Range   Color, Urine YELLOW YELLOW   APPearance CLEAR CLEAR   Specific Gravity, Urine 1.013 1.005 - 1.030   pH 6.0 5.0 - 8.0   Glucose, UA NEGATIVE NEGATIVE mg/dL   Hgb urine dipstick NEGATIVE NEGATIVE   Bilirubin Urine NEGATIVE NEGATIVE   Ketones, ur NEGATIVE NEGATIVE mg/dL    Protein, ur 272 (A) NEGATIVE mg/dL   Nitrite NEGATIVE NEGATIVE   Leukocytes,Ua TRACE (A) NEGATIVE   RBC /  HPF 0-5 0 - 5 RBC/hpf   WBC, UA 11-20 0 - 5 WBC/hpf   Bacteria, UA RARE (A) NONE SEEN   Squamous Epithelial / HPF 0-5 0 - 5 /HPF    Comment: Performed at [facility], [address] [place], [place] [address]  Basic metabolic panel     Status: Abnormal   Collection Time: 02/25/23  5:32 PM  Result Value Ref Range   Sodium 137 135 - 145 mmol/L   Potassium 5.0 3.5 - 5.1 mmol/L   Chloride 102 98 - 111 mmol/L   CO2 23 22 - 32 mmol/L   Glucose, Bld 175 (H) 70 - 99 mg/dL    Comment: Glucose reference range applies only to samples taken after fasting for at least 8 hours.   BUN 30 (H) 8 - 23 mg/dL   Creatinine, Ser 2.13 (H) 0.44 - 1.00 mg/dL   Calcium 08.6 8.9 - 57.8 mg/dL   GFR, Estimated 27 (L) >60 mL/min    Comment: (NOTE) Calculated using the CKD-EPI Creatinine Equation (2021)    Anion gap 12 5 - 15    Comment: Performed at [facility], [address], [place], [place] [address]  I-Stat CG4 Lactic Acid     Status: None   Collection Time: 02/25/23  5:40 PM  Result Value Ref Range   Lactic Acid, Venous 1.5 0.5 - 1.9 mmol/L   CT ABDOMEN PELVIS WO CONTRAST  Result Date: 02/25/2023 CLINICAL DATA:  Right lower quadrant pain and nausea beginning today. EXAM: CT ABDOMEN AND PELVIS WITHOUT CONTRAST TECHNIQUE: Multidetector CT imaging of the abdomen and pelvis was performed following the standard protocol without IV contrast. RADIATION DOSE REDUCTION: This exam was performed according to the departmental dose-optimization program which includes automated exposure control, adjustment of the mA and/or kV according to patient size and/or use of iterative reconstruction technique. COMPARISON:  None Available. FINDINGS: Lower chest: No acute findings. Hepatobiliary: No mass visualized on this unenhanced exam. Minimal perihepatic ascites noted. Prior cholecystectomy. No evidence of  biliary obstruction. Pancreas: No mass or inflammatory process visualized on this unenhanced exam. Spleen: Within normal limits in size. Benign calcified granuloma or cyst seen in the anterior spleen. Adrenals/Urinary tract: No evidence of urolithiasis or hydronephrosis. Unremarkable unopacified urinary bladder. Stomach/Bowel: Tiny hiatal hernia noted. Diffuse colonic diverticulosis is seen, without signs of diverticulitis. Although the appendix is not directly visualized, no inflammatory process seen in region of the cecum or elsewhere. Mildly dilated small bowel loops with air-fluid levels are seen within the abdomen and pelvis, with small bowel feces sign and transition point seen in the right lateral abdomen. No obstructing mass or inflammatory process identified, and this raises suspicion for an adhesion. Vascular/Lymphatic: No pathologically enlarged lymph nodes identified. No evidence of abdominal aortic aneurysm. Reproductive:  No mass or other significant abnormality. Other:  Tiny umbilical hernia, which contains only fat. Musculoskeletal: No suspicious bone lesions identified. Left hip prosthesis noted. IMPRESSION: Distal small bowel obstruction, with transition point in the right lateral abdomen suspicious for an adhesion. Colonic diverticulosis, without radiographic evidence of diverticulitis. Minimal perihepatic ascites. Tiny hiatal hernia. Tiny umbilical hernia, which contains only fat. Electronically Signed   By: Danae Orleans M.D.   On: 02/25/2023 18:40    Pending Labs Unresulted Labs (From admission, onward)    None       Vitals/Pain Today's Vitals   02/25/23 1638 02/25/23 1832 02/25/23 1900 02/25/23 1935  BP:  (!) 134/105 135/77 (!) 151/83  Pulse:  81 93 88  Resp:  18 20 19   Temp:    98.1 F (36.7 C)  TempSrc:    Oral  SpO2:  96% 95% 94%  Weight:      Height:      PainSc: 3        Isolation Precautions No active isolations  Medications Medications  LORazepam (ATIVAN)  injection 0.5 mg (has no administration in time range)  diatrizoate meglumine-sodium (GASTROGRAFIN) 66-10 % solution 90 mL (has no administration in time range)  oxyCODONE (Oxy IR/ROXICODONE) immediate release tablet 5 mg (5 mg Oral Given 02/25/23 1521)  ondansetron (ZOFRAN-ODT) disintegrating tablet 4 mg (4 mg Oral Given 02/25/23 1521)  lactated ringers bolus 500 mL (0 mLs Intravenous Stopped 02/25/23 1852)    Mobility walks with person assist     Focused Assessments    R Recommendations: See Admitting Provider Note  Report given to:   Additional Notes:

## 2023-02-25 NOTE — Consult Note (Incomplete)
Reason for Consult/Chief Complaint: SBO Consultant: Criss Alvine, MD  Haley Campbell is an 89 y.o. female.   HPI: 12F with a week of "stomach feeling funny" and acute onset of abdominal pain that began at 0500. Pain waxed and waned, but was intense enough for her to present to UC. No intervention performed, so came to the ED. Did not take any medications at home for pain, including her regularly scheduled medication. Associated nausea and two episodes of retching. Normal BM 10/3 and two small BMs since the onset of abdominal pain, both loose. Prior abdominal surgery: chole, appy, hysterectomy, oopherectomy.    Past Medical History:  Diagnosis Date   Allergic rhinitis    Anxiety    Degenerative disc disease    knees   Depression    Diabetes mellitus (HCC)    type 2 - fasting cbg 100-130   Diverticulosis    with episodic diverticulitis   Dyspepsia    persistent   Gout    HA (headache)    chronic   History of blood transfusion    Hypertension    Hypertension    Insomnia    severe   Low back pain    Chronic   Otosclerosis    with left stapedectomy   PONV (postoperative nausea and vomiting)    Sciatica    left lower extremity    Tinnitus of both ears    Chronic severe, and mixed conductive and sensorineural hearing loss    Past Surgical History:  Procedure Laterality Date   APPENDECTOMY     BREAST REDUCTION SURGERY Bilateral    CHOLECYSTECTOMY     EYE SURGERY Bilateral    cataracts   INCISION AND DRAINAGE Right 12/28/2016   Procedure: INCISION AND DRAINAGE RIGHT LONG FINGER;  Surgeon: Betha Loa, MD;  Location: [facility];  Service: Orthopedics;  Laterality: Right;   STAPEDECTOMY Left    middle ear   TOTAL ABDOMINAL HYSTERECTOMY     TOTAL HIP ARTHROPLASTY Left 07/01/2014   Procedure: TOTAL HIP ARTHROPLASTY;  Surgeon: Nestor Lewandowsky, MD;  Location: [facility] OR;  Service: Orthopedics;  Laterality: Left;   TOTAL KNEE ARTHROPLASTY Right     Family History   Problem Relation Age of Onset   CVA Mother    CAD Father    Hypertension Brother    Prostate cancer Brother    Cancer Sister        BRAIN TUMOR    Social History:  reports that she has never smoked. She has never used smokeless tobacco. She reports that she does not drink alcohol and does not use drugs.  Allergies:  Allergies  Allergen Reactions   Latex Other (See Comments)    Severe mucosal allergy   Codeine Nausea Only   Colchicine Diarrhea and Nausea And Vomiting   Hctz [Hydrochlorothiazide] Nausea Only and Other (See Comments)    Fever and Headache   Meperidine     Other reaction(s): RASH   Sonata [Zaleplon] Nausea Only and Other (See Comments)    No sleep   Sulfa Antibiotics Rash    Medications: I have reviewed the patient's current medications.  Results for orders placed or performed during the hospital encounter of 02/25/23 (from the past 48 hour(s))  Lipase, blood     Status: Abnormal   Collection Time: 02/25/23  3:21 PM  Result Value Ref Range   Lipase 56 (H) 11 - 51 U/L    Comment: HEMOLYSIS AT THIS LEVEL MAY AFFECT  RESULT Performed at [facility], [address], [place], [place] [address]   Comprehensive metabolic panel     Status: Abnormal   Collection Time: 02/25/23  3:21 PM  Result Value Ref Range   Sodium 137 135 - 145 mmol/L   Potassium 5.7 (H) 3.5 - 5.1 mmol/L    Comment: HEMOLYSIS AT THIS LEVEL MAY AFFECT RESULT   Chloride 101 98 - 111 mmol/L   CO2 23 22 - 32 mmol/L   Glucose, Bld 182 (H) 70 - 99 mg/dL    Comment: Glucose reference range applies only to samples taken after fasting for at least 8 hours.   BUN 30 (H) 8 - 23 mg/dL   Creatinine, Ser 2.13 (H) 0.44 - 1.00 mg/dL   Calcium 08.6 8.9 - 57.8 mg/dL   Total Protein 7.8 6.5 - 8.1 g/dL   Albumin 4.0 3.5 - 5.0 g/dL   AST 31 15 - 41 U/L    Comment: HEMOLYSIS AT THIS LEVEL MAY AFFECT RESULT   ALT 13 0 - 44 U/L    Comment: HEMOLYSIS AT THIS LEVEL MAY AFFECT RESULT   Alkaline  Phosphatase 76 38 - 126 U/L    Comment: HEMOLYSIS AT THIS LEVEL MAY AFFECT RESULT   Total Bilirubin 1.1 0.3 - 1.2 mg/dL    Comment: HEMOLYSIS AT THIS LEVEL MAY AFFECT RESULT   GFR, Estimated 24 (L) >60 mL/min    Comment: (NOTE) Calculated using the CKD-EPI Creatinine Equation (2021)    Anion gap 13 5 - 15    Comment: Performed at [facility], [address] [place], [place] [address]  CBC     Status: Abnormal   Collection Time: 02/25/23  3:21 PM  Result Value Ref Range   WBC 17.6 (H) 4.0 - 10.5 K/uL   RBC 4.99 3.87 - 5.11 MIL/uL   Hemoglobin 13.7 12.0 - 15.0 g/dL   HCT 95.2 84.1 - 32.4 %   MCV 86.6 80.0 - 100.0 fL   MCH 27.5 26.0 - 34.0 pg   MCHC 31.7 30.0 - 36.0 g/dL   RDW 40.1 02.7 - 25.3 %   Platelets 209 150 - 400 K/uL   nRBC 0.0 0.0 - 0.2 %    Comment: Performed at [facility], [address] [place], [place] [address]  Urinalysis, Routine w reflex microscopic -Urine, Clean Catch     Status: Abnormal   Collection Time: 02/25/23  3:21 PM  Result Value Ref Range   Color, Urine YELLOW YELLOW   APPearance CLEAR CLEAR   Specific Gravity, Urine 1.013 1.005 - 1.030   pH 6.0 5.0 - 8.0   Glucose, UA NEGATIVE NEGATIVE mg/dL   Hgb urine dipstick NEGATIVE NEGATIVE   Bilirubin Urine NEGATIVE NEGATIVE   Ketones, ur NEGATIVE NEGATIVE mg/dL   Protein, ur 347 (A) NEGATIVE mg/dL   Nitrite NEGATIVE NEGATIVE   Leukocytes,Ua TRACE (A) NEGATIVE   RBC / HPF 0-5 0 - 5 RBC/hpf   WBC, UA 11-20 0 - 5 WBC/hpf   Bacteria, UA RARE (A) NONE SEEN   Squamous Epithelial / HPF 0-5 0 - 5 /HPF    Comment: Performed at [facility], [address], [place], [place] [address]  Basic metabolic panel     Status: Abnormal   Collection Time: 02/25/23  5:32 PM  Result Value Ref Range   Sodium 137 135 - 145 mmol/L   Potassium 5.0 3.5 - 5.1 mmol/L   Chloride 102 98 -  111 mmol/L   CO2 23 22 - 32 mmol/L   Glucose, Bld 175 (H) 70 - 99 mg/dL    Comment: Glucose reference range applies  only to samples taken after fasting for at least 8 hours.   BUN 30 (H) 8 - 23 mg/dL   Creatinine, Ser 0.86 (H) 0.44 - 1.00 mg/dL   Calcium 57.8 8.9 - 46.9 mg/dL   GFR, Estimated 27 (L) >60 mL/min    Comment: (NOTE) Calculated using the CKD-EPI Creatinine Equation (2021)    Anion gap 12 5 - 15    Comment: Performed at [facility], [address] [place], [place] [address]  I-Stat CG4 Lactic Acid     Status: None   Collection Time: 02/25/23  5:40 PM  Result Value Ref Range   Lactic Acid, Venous 1.5 0.5 - 1.9 mmol/L    CT ABDOMEN PELVIS WO CONTRAST  Result Date: 02/25/2023 CLINICAL DATA:  Right lower quadrant pain and nausea beginning today. EXAM: CT ABDOMEN AND PELVIS WITHOUT CONTRAST TECHNIQUE: Multidetector CT imaging of the abdomen and pelvis was performed following the standard protocol without IV contrast. RADIATION DOSE REDUCTION: This exam was performed according to the departmental dose-optimization program which includes automated exposure control, adjustment of the mA and/or kV according to patient size and/or use of iterative reconstruction technique. COMPARISON:  None Available. FINDINGS: Lower chest: No acute findings. Hepatobiliary: No mass visualized on this unenhanced exam. Minimal perihepatic ascites noted. Prior cholecystectomy. No evidence of biliary obstruction. Pancreas: No mass or inflammatory process visualized on this unenhanced exam. Spleen: Within normal limits in size. Benign calcified granuloma or cyst seen in the anterior spleen. Adrenals/Urinary tract: No evidence of urolithiasis or hydronephrosis. Unremarkable unopacified urinary bladder. Stomach/Bowel: Tiny hiatal hernia noted. Diffuse colonic diverticulosis is seen, without signs of diverticulitis. Although the appendix is not directly visualized, no inflammatory process seen in region of the cecum or elsewhere. Mildly dilated small bowel loops with air-fluid levels are seen within the abdomen and pelvis, with  small bowel feces sign and transition point seen in the right lateral abdomen. No obstructing mass or inflammatory process identified, and this raises suspicion for an adhesion. Vascular/Lymphatic: No pathologically enlarged lymph nodes identified. No evidence of abdominal aortic aneurysm. Reproductive:  No mass or other significant abnormality. Other:  Tiny umbilical hernia, which contains only fat. Musculoskeletal: No suspicious bone lesions identified. Left hip prosthesis noted. IMPRESSION: Distal small bowel obstruction, with transition point in the right lateral abdomen suspicious for an adhesion. Colonic diverticulosis, without radiographic evidence of diverticulitis. Minimal perihepatic ascites. Tiny hiatal hernia. Tiny umbilical hernia, which contains only fat. Electronically Signed   By: Danae Orleans M.D.   On: 02/25/2023 18:40    ROS 10 point review of systems is negative except as listed above in HPI.   Physical Exam Blood pressure (!) 151/83, pulse 88, temperature 98.1 F (36.7 C), temperature source Oral, resp. rate 19, height 5' 2.5" (1.588 m), weight 68.2 kg, SpO2 94%. Constitutional: well-developed, well-nourished HEENT: pupils equal, round, reactive to light, 2mm b/l, moist conjunctiva, external inspection of ears and nose normal, hearing intact Oropharynx: normal oropharyngeal mucosa, normal dentition Neck: no thyromegaly, trachea midline, no midline cervical tenderness to palpation Chest: breath sounds equal bilaterally, normal respiratory effort, no midline or lateral chest wall tenderness to palpation/deformity Abdomen: soft, NT, mildly distended, no bruising, no hepatosplenomegaly GU: normal female genitalia  Skin: warm, dry, no rashes Psych: normal memory, normal mood/affect     Assessment/Plan: SBO  SBO - NGT in place, rec'd gastrografin at 0005, XR in AM. Given age and surgical history, would recommend two trials of gastrografin before decision for surgical intervention  unless concern for acute abdomen and we discussed this.  FEN - NPO except sips/chips DVT - SCDs, LMWH Dispo - med-surg    Diamantina Monks, MD General and Trauma Surgery [facility]

## 2023-02-26 ENCOUNTER — Inpatient Hospital Stay (HOSPITAL_COMMUNITY): Payer: Medicare Other

## 2023-02-26 DIAGNOSIS — K56609 Unspecified intestinal obstruction, unspecified as to partial versus complete obstruction: Secondary | ICD-10-CM | POA: Diagnosis not present

## 2023-02-26 LAB — CBC
HCT: 39.8 % (ref 36.0–46.0)
Hemoglobin: 12.7 g/dL (ref 12.0–15.0)
MCH: 28 pg (ref 26.0–34.0)
MCHC: 31.9 g/dL (ref 30.0–36.0)
MCV: 87.9 fL (ref 80.0–100.0)
Platelets: 180 10*3/uL (ref 150–400)
RBC: 4.53 MIL/uL (ref 3.87–5.11)
RDW: 15 % (ref 11.5–15.5)
WBC: 9.3 10*3/uL (ref 4.0–10.5)
nRBC: 0 % (ref 0.0–0.2)

## 2023-02-26 LAB — BASIC METABOLIC PANEL
Anion gap: 11 (ref 5–15)
BUN: 22 mg/dL (ref 8–23)
CO2: 24 mmol/L (ref 22–32)
Calcium: 9.5 mg/dL (ref 8.9–10.3)
Chloride: 105 mmol/L (ref 98–111)
Creatinine, Ser: 1.69 mg/dL — ABNORMAL HIGH (ref 0.44–1.00)
GFR, Estimated: 27 mL/min — ABNORMAL LOW (ref >60)
Glucose, Bld: 163 mg/dL — ABNORMAL HIGH (ref 70–99)
Potassium: 4 mmol/L (ref 3.5–5.1)
Sodium: 140 mmol/L (ref 135–145)

## 2023-02-26 LAB — PHOSPHORUS: Phosphorus: 3.6 mg/dL (ref 2.5–4.6)

## 2023-02-26 LAB — MAGNESIUM: Magnesium: 1.9 mg/dL (ref 1.7–2.4)

## 2023-02-26 LAB — GLUCOSE, CAPILLARY
Glucose-Capillary: 184 mg/dL — ABNORMAL HIGH (ref 70–99)
Glucose-Capillary: 163 mg/dL — ABNORMAL HIGH (ref 70–99)
Glucose-Capillary: 167 mg/dL — ABNORMAL HIGH (ref 70–99)
Glucose-Capillary: 146 mg/dL — ABNORMAL HIGH (ref 70–99)
Glucose-Capillary: 184 mg/dL — ABNORMAL HIGH (ref 70–99)
Glucose-Capillary: 144 mg/dL — ABNORMAL HIGH (ref 70–99)

## 2023-02-26 MED ORDER — PHENOL 1.4 % MT LIQD
1.0000 | OROMUCOSAL | Status: DC | PRN
  Administered 2023-02-26: 1 via OROMUCOSAL
  Filled 2023-02-26: qty 177

## 2023-02-26 MED ORDER — PANTOPRAZOLE SODIUM 40 MG IV SOLR
40.0000 mg | INTRAVENOUS | Status: DC
  Administered 2023-02-26 – 2023-02-27 (×2): 40 mg via INTRAVENOUS
  Filled 2023-02-26 (×2): qty 10

## 2023-02-26 MED ORDER — HEPARIN SODIUM (PORCINE) 5000 UNIT/ML IJ SOLN
5000.0000 [IU] | Freq: Three times a day (TID) | INTRAMUSCULAR | Status: DC
  Administered 2023-02-26 – 2023-02-27 (×3): 5000 [IU] via SUBCUTANEOUS
  Filled 2023-02-26 (×3): qty 1

## 2023-02-26 MED ORDER — HYDRALAZINE HCL 20 MG/ML IJ SOLN: 5.0000 mg | Freq: Four times a day (QID) | INTRAMUSCULAR | Status: DC | PRN

## 2023-02-26 MED ORDER — INSULIN ASPART 100 UNIT/ML IJ SOLN
0.0000 [IU] | INTRAMUSCULAR | Status: DC
  Administered 2023-02-26 (×2): 1 [IU] via SUBCUTANEOUS
  Administered 2023-02-26 (×4): 2 [IU] via SUBCUTANEOUS
  Administered 2023-02-27 (×2): 1 [IU] via SUBCUTANEOUS

## 2023-02-26 MED ORDER — PROCHLORPERAZINE EDISYLATE 10 MG/2ML IJ SOLN
5.0000 mg | Freq: Four times a day (QID) | INTRAMUSCULAR | Status: DC | PRN
  Administered 2023-02-26: 5 mg via INTRAVENOUS
  Filled 2023-02-26: qty 2

## 2023-02-26 MED ORDER — LORAZEPAM 2 MG/ML IJ SOLN
0.5000 mg | Freq: Four times a day (QID) | INTRAMUSCULAR | Status: AC | PRN
  Administered 2023-02-26 (×2): 0.5 mg via INTRAVENOUS
  Filled 2023-02-26 (×2): qty 1

## 2023-02-26 MED ORDER — MELATONIN 3 MG PO TABS
3.0000 mg | ORAL_TABLET | Freq: Every day | ORAL | Status: DC
  Administered 2023-02-26: 3 mg
  Filled 2023-02-26: qty 1

## 2023-02-26 MED ORDER — MELATONIN 3 MG PO TABS
3.0000 mg | ORAL_TABLET | Freq: Every day | ORAL | Status: DC
  Filled 2023-02-26: qty 1

## 2023-02-26 MED ORDER — SODIUM CHLORIDE 0.9 % IV SOLN: INTRAVENOUS | Status: AC

## 2023-02-26 MED ORDER — SODIUM CHLORIDE 0.9 % IV SOLN
2.0000 g | Freq: Every day | INTRAVENOUS | Status: DC
  Administered 2023-02-26: 2 g via INTRAVENOUS
  Filled 2023-02-26: qty 20

## 2023-02-26 MED ORDER — SODIUM CHLORIDE 0.9 % IV SOLN
1.0000 g | Freq: Every day | INTRAVENOUS | Status: DC
  Administered 2023-02-27: 1 g via INTRAVENOUS
  Filled 2023-02-26: qty 10

## 2023-02-26 NOTE — ED Notes (Signed)
This RN followed up with Margo Aye DO to check on NG tube placement so that inpatient staff could administer Gastrofin to pt. Inpatient staff updated on confirmatory response on placement by [facility] DO.

## 2023-02-26 NOTE — Progress Notes (Signed)
IV team called to bedside to evaluate for new IV site. Current IV functioning properly without any need for second site. To return if any further vascular access needed.

## 2023-02-26 NOTE — Progress Notes (Signed)
   02/26/23 1500  [REMOVED] NG/OG Vented/Dual Lumen 16 Fr. Right nare Marking at nare/corner of mouth 50 cm  Removal Date/Time: 02/26/23 1515  Placement Date/Time: 02/25/23 2059   Person Inserting LDA: Joselyn Glassman, RN  Tube Size (Fr.): 16 Fr.  Tube Location: Right nare  Secured by: Bridle  Initial Placement Verification: Xray  Tube Position (Required): Marking at ...  Output (mL) 0 mL     NGT assessed per order, no residual noted. NGT removed per order/policy no complications.

## 2023-02-26 NOTE — Evaluation (Signed)
Physical Therapy Evaluation Patient Details Name: KAMEKA BREAM MRN: [id number] DOB: 28-Jul-1923 Today's Date: 02/26/2023  History of Present Illness  89 y.o. female presents to [facility] hospital on 02/25/2023 with nausea and diffuse abdominal pain. CT abdomen revealed distal small bowel obstruction. PMH includes chronic anxiety, DMII, HTN, CKD III, insomnia.  Clinical Impression  Pt presents to PT with mild deficits in strength, power and endurance compared to baseline. Pt is able to ambulate for household distances with support of RW throughout session. Pt demonstrates mild instability initially when standing from couch, but otherwise demonstrates no balance deviations. PT encourages frequent mobilization for the remainder of session. PT recommends discharge home when medically appropriate.        If plan is discharge home, recommend the following: Assistance with cooking/housework;Assist for transportation   Can travel by private vehicle        Equipment Recommendations None recommended by PT  Recommendations for Other Services       Functional Status Assessment Patient has had a recent decline in their functional status and demonstrates the ability to make significant improvements in function in a reasonable and predictable amount of time.     Precautions / Restrictions Precautions Precautions: Fall Precaution Comments: NG tube Restrictions Weight Bearing Restrictions: No      Mobility  Bed Mobility                    Transfers Overall transfer level: Needs assistance Equipment used: Straight cane Transfers: Sit to/from Stand Sit to Stand: Contact guard assist                Ambulation/Gait Ambulation/Gait assistance: Supervision Gait Distance (Feet): 200 Feet Assistive device: Straight cane Gait Pattern/deviations: Step-through pattern Gait velocity: reduced Gait velocity interpretation: <1.8 ft/sec, indicate of risk for recurrent falls   General Gait  Details: steady step-through gait, reduced speed, pt utilizing off hand to feel for objects when approaching at times due to vision deficits  Stairs            Wheelchair Mobility     Tilt Bed    Modified Rankin (Stroke Patients Only)       Balance Overall balance assessment: Needs assistance Sitting-balance support: No upper extremity supported, Feet supported Sitting balance-Leahy Scale: Good     Standing balance support: Single extremity supported, Reliant on assistive device for balance Standing balance-Leahy Scale: Poor                               Pertinent Vitals/Pain Pain Assessment Pain Assessment: 0-10 Pain Score: 8  Pain Location: throat Pain Descriptors / Indicators: Sore Pain Intervention(s): Monitored during session    Home Living Family/patient expects to be discharged to:: Private residence Living Arrangements: Alone Available Help at Discharge: Family;Available PRN/intermittently Type of Home: House Home Access: Stairs to enter Entrance Stairs-Rails: Can reach both;Left;Right Entrance Stairs-Number of Steps: 3   Home Layout: One level Home Equipment: Agricultural consultant (2 wheels);Grab bars - tub/shower;Grab bars - toilet;Cane - single point      Prior Function Prior Level of Function : Independent/Modified Independent             Mobility Comments: ambulatory with SPC, assist for transportation due to vision deficits       Extremity/Trunk Assessment   Upper Extremity Assessment Upper Extremity Assessment: Overall WFL for tasks assessed    Lower Extremity Assessment Lower Extremity Assessment: Generalized weakness  Cervical / Trunk Assessment Cervical / Trunk Assessment: Normal  Communication   Communication Communication: Hearing impairment Cueing Techniques: Verbal cues  Cognition Arousal: Alert Behavior During Therapy: WFL for tasks assessed/performed Overall Cognitive Status: Within Functional Limits for  tasks assessed                                          General Comments General comments (skin integrity, edema, etc.): VSS on RA    Exercises     Assessment/Plan    PT Assessment Patient needs continued PT services  PT Problem List Decreased strength;Decreased activity tolerance;Decreased balance;Decreased mobility       PT Treatment Interventions DME instruction;Gait training;Stair training;Functional mobility training;Balance training;Therapeutic activities;Therapeutic exercise;Patient/family education    PT Goals (Current goals can be found in the Care Plan section)  Acute Rehab PT Goals Patient Stated Goal: to return home PT Goal Formulation: With patient Time For Goal Achievement: 03/12/23 Potential to Achieve Goals: Good Additional Goals Additional Goal #1: Pt will score >19/24 on the DGI to indicate a reduced risk for falls    Frequency Min 1X/week     Co-evaluation               AM-PAC PT "6 Clicks" Mobility  Outcome Measure Help needed turning from your back to your side while in a flat bed without using bedrails?: A Little Help needed moving from lying on your back to sitting on the side of a flat bed without using bedrails?: A Little Help needed moving to and from a bed to a chair (including a wheelchair)?: A Little Help needed standing up from a chair using your arms (e.g., wheelchair or bedside chair)?: A Little Help needed to walk in hospital room?: A Little Help needed climbing 3-5 steps with a railing? : A Little 6 Click Score: 18    End of Session   Activity Tolerance: Patient tolerated treatment well Patient left: in chair;with call bell/phone within reach;with family/visitor present Nurse Communication: Mobility status PT Visit Diagnosis: Muscle weakness (generalized) (M62.81)    Time: 0981-1914 PT Time Calculation (min) (ACUTE ONLY): 16 min   Charges:   PT Evaluation $PT Eval Low Complexity: 1 Low   PT General  Charges $$ ACUTE PT VISIT: 1 Visit         Arlyss Gandy, PT, DPT Acute Rehabilitation Office [phone]   Arlyss Gandy 02/26/2023, 12:45 PM

## 2023-02-26 NOTE — Evaluation (Signed)
Occupational Therapy Evaluation Patient Details Name: Haley Campbell MRN: [id number] DOB: [birth year] Today's Date: 02/26/2023   History of Present Illness 89 y.o. female presents to [facility] hospital on 02/25/2023 with nausea and diffuse abdominal pain. CT abdomen revealed distal small bowel obstruction. PMH includes chronic anxiety, DMII, HTN, CKD III, insomnia.   Clinical Impression   Patient PTA lives alone at home, but has family for any needed assist.  Currently needing supervision to Min A for in room mobility at cane level and ADL completion.  OT to follow in the acute setting to address deficits, but no post acute OT is anticipated.  Just completed HH PT.         If plan is discharge home, recommend the following: Assist for transportation;Assistance with cooking/housework    Functional Status Assessment  Patient has had a recent decline in their functional status and demonstrates the ability to make significant improvements in function in a reasonable and predictable amount of time.  Equipment Recommendations  Tub/shower seat    Recommendations for Other Services       Precautions / Restrictions Precautions Precautions: Fall Restrictions Weight Bearing Restrictions: No      Mobility Bed Mobility Overal bed mobility: Modified Independent                  Transfers Overall transfer level: Needs assistance Equipment used: Straight cane Transfers: Sit to/from Stand, Bed to chair/wheelchair/BSC Sit to Stand: Supervision     Step pivot transfers: Contact guard assist, Min assist     General transfer comment: New environment, mild instability.      Balance Overall balance assessment: Needs assistance Sitting-balance support: Feet supported Sitting balance-Leahy Scale: Good     Standing balance support: Single extremity supported Standing balance-Leahy Scale: Poor                             ADL either performed or assessed with clinical judgement    ADL       Grooming: Oral care;Wash/dry hands;Supervision/safety;Standing               Lower Body Dressing: Contact guard assist;Sit to/from stand   Toilet Transfer: Contact guard assist;Minimal assistance;Ambulation;Regular Toilet                   Vision Baseline Vision/History: 3 Glaucoma;6 Macular Degeneration Ability to See in Adequate Light: 3 Highly impaired Patient Visual Report: No change from baseline       Perception Perception: Not tested       Praxis Praxis: Not tested       Pertinent Vitals/Pain Pain Assessment Pain Assessment: No/denies pain     Extremity/Trunk Assessment Upper Extremity Assessment Upper Extremity Assessment: Overall WFL for tasks assessed   Lower Extremity Assessment Lower Extremity Assessment: Defer to PT evaluation   Cervical / Trunk Assessment Cervical / Trunk Assessment: Normal   Communication Communication Communication: No apparent difficulties   Cognition Arousal: Alert Behavior During Therapy: WFL for tasks assessed/performed Overall Cognitive Status: Within Functional Limits for tasks assessed                                       General Comments       Exercises     Shoulder Instructions      Home Living Family/patient expects to be discharged to:: Private residence Living Arrangements:  Alone Available Help at Discharge: Family;Available PRN/intermittently Type of Home: House Home Access: Stairs to enter [employer] of Steps: 3 Entrance Stairs-Rails: Can reach both;Left;Right Home Layout: One level     Bathroom Shower/Tub: Walk-in shower;Tub/shower unit   Bathroom Toilet: Standard Bathroom Accessibility: Yes How Accessible: Accessible via walker Home Equipment: Rolling Walker (2 wheels);Grab bars - tub/shower;Grab bars - toilet;Cane - single point          Prior Functioning/Environment Prior Level of Function : Independent/Modified Independent                         OT Problem List: Impaired balance (sitting and/or standing)      OT Treatment/Interventions: Self-care/ADL training;Therapeutic activities;Balance training    OT Goals(Current goals can be found in the care plan section) Acute Rehab OT Goals Patient Stated Goal: Return home OT Goal Formulation: With patient Time For Goal Achievement: 03/11/23 Potential to Achieve Goals: Good ADL Goals Pt Will Perform Grooming: with modified independence;standing Pt Will Perform Lower Body Dressing: with modified independence;sit to/from stand Pt Will Transfer to Toilet: with modified independence;ambulating;regular height toilet  OT Frequency: Min 1X/week    Co-evaluation              AM-PAC OT "6 Clicks" Daily Activity     Outcome Measure Help from another person eating meals?: None Help from another person taking care of personal grooming?: A Little Help from another person toileting, which includes using toliet, bedpan, or urinal?: A Little Help from another person bathing (including washing, rinsing, drying)?: A Little Help from another person to put on and taking off regular upper body clothing?: None Help from another person to put on and taking off regular lower body clothing?: A Little 6 Click Score: 20   End of Session Nurse Communication: Mobility status  Activity Tolerance: Patient tolerated treatment well Patient left: in chair;with call bell/phone within reach;with family/visitor present  OT Visit Diagnosis: Unsteadiness on feet (R26.81)                Time: 1308-6578 OT Time Calculation (min): 22 min Charges:  OT General Charges $OT Visit: 1 Visit OT Evaluation $OT Eval Moderate Complexity: 1 Mod  02/26/2023  RP, OTR/L  Acute Rehabilitation Services  Office:  [phone]   Suzanna Obey 02/26/2023, 9:23 AM

## 2023-02-26 NOTE — Progress Notes (Signed)
Brief same day note:   Patient is a 89 year old female with history of anxiety, type diabetes, hypertension, CKD stage IIIb who presented with diffuse abdominal pain yesterday with nausea but no vomiting..  CT abdomen/pelvis showed a distal small bowel obstruction with transition point in the right lateral abdomen suspicion for adhesion.  UA suspicious for UTI.  General surgery consulted.  Started on conservative management.  Currently on NG tube. Patient seen and examined at the bedside this morning, she was sitting in the chair.  Abdominal x-ray done this morning shows contrast in the colon, nondilated small bowel.  NG tube clamped.  Patient appears comfortable without any abdomen pain, nausea or vomiting.  NG tube draining some dark red fluid suspected to be from the radiation of NG tube in the stomach.  Possibility of removal of NG tube, starting on clear liquid diet later today.  Assessment and plan:  SBO: Presented with abdominal pain, nausea.  CT abdomen/pelvis as above.  Continue gentle  IV fluids.  Currently under conservative management.  On NG tube.  General surgery following.  Suspected UTI: Follow urine culture.  Start on ceftriaxone.  UA was suspicious for UTI.  CKD stage IIIb: Recent labs not available.  Creatinine on 07/03/2014 was 1.44.  Currently on gentle IV fluids  Leukocytosis: Could be reactive or from UTI.  Continue to monitor.  Hypertension: Currently mildly hypertensive.  Oral antihypertensives on hold.  Continue as needed medications for severe hypertension  Anxiety: On IV Ativan as needed  Debility/deconditioning: PT/OT consulted  Discussed with son and daughter-in-law at bedside.

## 2023-02-26 NOTE — H&P (Addendum)
History and Physical  Haley Campbell NWG:[id number] DOB: 1923/07/09 DOA: 02/25/2023  Referring physician: Dr. Criss Alvine, EDP  PCP: Marden Noble, MD  Outpatient Specialists: None Patient coming from: Home  Chief Complaint: Abdominal pain and nausea.  HPI: Haley Campbell is a 89 y.o. female with medical history significant for chronic anxiety, type 2 diabetes, hypertension, CKD 3B, chronic insomnia, who presented to the ED due to diffuse abdominal pain associated with nausea without vomiting.  No reported subjective fevers or chills.  Symptoms started today.  In the ED, tachypneic.  UA moderately positive for pyuria.  CT abdomen pelvis without contrast revealed distal small bowel obstruction with transition point in the right lateral abdomen suspicious for an adhesion.  EDP discussed the case with general surgery.  Recommended NG tube placement and admission by hospitalist service.  General surgery will follow in consultation.  Admitted by [facility].  ED Course: Temperature 98.1.  BP 156/74, pulse 84, respiratory 24, O2 saturation 96% on room air.  Lab studies remarkable for WBC 17.6.  Serum potassium 5.7, repeat 5.0.  Glucose 182 repeat 175.  Creatinine 1.86 repeat 1.76.  GFR 24, 27.  Review of Systems: Review of systems as noted in the HPI. All other systems reviewed and are negative.   Past Medical History:  Diagnosis Date   Allergic rhinitis    Anxiety    Degenerative disc disease    knees   Depression    Diabetes mellitus (HCC)    type 2 - fasting cbg 100-130   Diverticulosis    with episodic diverticulitis   Dyspepsia    persistent   Gout    HA (headache)    chronic   History of blood transfusion    Hypertension    Hypertension    Insomnia    severe   Low back pain    Chronic   Otosclerosis    with left stapedectomy   PONV (postoperative nausea and vomiting)    Sciatica    left lower extremity    Tinnitus of both ears    Chronic severe, and mixed conductive and  sensorineural hearing loss   Past Surgical History:  Procedure Laterality Date   APPENDECTOMY     BREAST REDUCTION SURGERY Bilateral    CHOLECYSTECTOMY     EYE SURGERY Bilateral    cataracts   INCISION AND DRAINAGE Right 12/28/2016   Procedure: INCISION AND DRAINAGE RIGHT LONG FINGER;  Surgeon: Betha Loa, MD;  Location: [facility];  Service: Orthopedics;  Laterality: Right;   STAPEDECTOMY Left    middle ear   TOTAL ABDOMINAL HYSTERECTOMY     TOTAL HIP ARTHROPLASTY Left 07/01/2014   Procedure: TOTAL HIP ARTHROPLASTY;  Surgeon: Nestor Lewandowsky, MD;  Location: [facility] OR;  Service: Orthopedics;  Laterality: Left;   TOTAL KNEE ARTHROPLASTY Right     Social History:  reports that she has never smoked. She has never used smokeless tobacco. She reports that she does not drink alcohol and does not use drugs.   Allergies  Allergen Reactions   Latex Other (See Comments)    Severe mucosal allergy   Codeine Nausea Only   Colchicine Diarrhea and Nausea And Vomiting   Hctz [Hydrochlorothiazide] Nausea Only and Other (See Comments)    Fever and Headache   Meperidine     Other reaction(s): RASH   Sonata [Zaleplon] Nausea Only and Other (See Comments)    No sleep   Sulfa Antibiotics Rash    Family History  Problem  Relation Age of Onset   CVA Mother    CAD Father    Hypertension Brother    Prostate cancer Brother    Cancer Sister        BRAIN TUMOR      Prior to Admission medications   Medication Sig Start Date End Date Taking? Authorizing Provider  allopurinol (ZYLOPRIM) 100 MG tablet Take 100 mg by mouth daily. 01/29/23  Yes [provider]  ALPRAZolam Prudy Feeler) 0.5 MG tablet Take 2 tablets by mouth at bedtime.   Yes [provider]  amLODipine (NORVASC) 5 MG tablet Take 5 mg by mouth daily. 12/22/22  Yes [provider]  Cholecalciferol (VITAMIN D3 EXTRA STRENGTH PO) Take 1 tablet by mouth daily.   Yes [provider]  traMADol (ULTRAM) 50  MG tablet Take 50 mg by mouth 3 (three) times daily as needed for moderate pain.   Yes [provider]  zolpidem (AMBIEN) 10 MG tablet Take 10 mg by mouth at bedtime as needed for sleep.   Yes [provider]  glucose blood test strip 1 each by Other route as needed for other. Use as instructed    [provider]  ibuprofen (ADVIL,MOTRIN) 200 MG tablet Take 2 tablets (400 mg total) by mouth every 6 (six) hours as needed for fever or mild pain. Patient not taking: Reported on 02/25/2023 04/11/14   Christiane Ha, MD    Physical Exam: BP (!) 156/74 (BP Location: Right Arm)   Pulse 84   Temp 98.1 F (36.7 C) (Oral)   Resp 16   Ht 5' 2.5" (1.588 m)   Wt 68.2 kg   SpO2 96%   BMI 27.06 kg/m   General: 89 y.o. year-old female well developed well nourished in no acute distress.  Alert and oriented x3.  Hard of hearing. Cardiovascular: Regular rate and rhythm with no rubs or gallops.  No thyromegaly or JVD noted.  No lower extremity edema. 2/4 pulses in all 4 extremities. Respiratory: Clear to auscultation with no wheezes or rales. Good inspiratory effort. Abdomen: Mildly to moderately distended, hypoactive bowel sounds. Muskuloskeletal: No cyanosis, clubbing or edema noted bilaterally Neuro: CN II-XII intact, strength, sensation, reflexes Skin: No ulcerative lesions noted or rashes Psychiatry: Judgement and insight appear normal. Mood is appropriate for condition and setting          Labs on Admission:  Basic Metabolic Panel: Recent Labs  Lab 02/25/23 1521 02/25/23 1732  NA 137 137  K 5.7* 5.0  CL 101 102  CO2 23 23  GLUCOSE 182* 175*  BUN 30* 30*  CREATININE 1.86* 1.73*  CALCIUM 10.2 10.2   Liver Function Tests: Recent Labs  Lab 02/25/23 1521  AST 31  ALT 13  ALKPHOS 76  BILITOT 1.1  PROT 7.8  ALBUMIN 4.0   Recent Labs  Lab 02/25/23 1521  LIPASE 56*   No results for input(s): "AMMONIA" in the last 168 hours. CBC: Recent Labs  Lab  02/25/23 1521  WBC 17.6*  HGB 13.7  HCT 43.2  MCV 86.6  PLT 209   Cardiac Enzymes: No results for input(s): "CKTOTAL", "CKMB", "CKMBINDEX", "TROPONINI" in the last 168 hours.  BNP (last 3 results) No results for input(s): "BNP" in the last 8760 hours.  ProBNP (last 3 results) No results for input(s): "PROBNP" in the last 8760 hours.  CBG: No results for input(s): "GLUCAP" in the last 168 hours.  Radiological Exams on Admission: DG Abd Portable 1V-Small Bowel Protocol-Position Verification  Result Date: 02/25/2023 CLINICAL DATA:  Nasogastric tube placement. EXAM: PORTABLE ABDOMEN - 1 VIEW COMPARISON:  CT earlier today FINDINGS: Tip and side port of the enteric tube below the diaphragm in the stomach. Left upper quadrant rounded calcification is splenic on prior CT. Few prominent loops of small bowel in the abdomen or partially included in the field of view. IMPRESSION: Tip and side port of the enteric tube below the diaphragm in the stomach. Electronically Signed   By: Narda Rutherford M.D.   On: 02/25/2023 22:45   CT ABDOMEN PELVIS WO CONTRAST  Result Date: 02/25/2023 CLINICAL DATA:  Right lower quadrant pain and nausea beginning today. EXAM: CT ABDOMEN AND PELVIS WITHOUT CONTRAST TECHNIQUE: Multidetector CT imaging of the abdomen and pelvis was performed following the standard protocol without IV contrast. RADIATION DOSE REDUCTION: This exam was performed according to the departmental dose-optimization program which includes automated exposure control, adjustment of the mA and/or kV according to patient size and/or use of iterative reconstruction technique. COMPARISON:  None Available. FINDINGS: Lower chest: No acute findings. Hepatobiliary: No mass visualized on this unenhanced exam. Minimal perihepatic ascites noted. Prior cholecystectomy. No evidence of biliary obstruction. Pancreas: No mass or inflammatory process visualized on this unenhanced exam. Spleen: Within normal limits in  size. Benign calcified granuloma or cyst seen in the anterior spleen. Adrenals/Urinary tract: No evidence of urolithiasis or hydronephrosis. Unremarkable unopacified urinary bladder. Stomach/Bowel: Tiny hiatal hernia noted. Diffuse colonic diverticulosis is seen, without signs of diverticulitis. Although the appendix is not directly visualized, no inflammatory process seen in region of the cecum or elsewhere. Mildly dilated small bowel loops with air-fluid levels are seen within the abdomen and pelvis, with small bowel feces sign and transition point seen in the right lateral abdomen. No obstructing mass or inflammatory process identified, and this raises suspicion for an adhesion. Vascular/Lymphatic: No pathologically enlarged lymph nodes identified. No evidence of abdominal aortic aneurysm. Reproductive:  No mass or other significant abnormality. Other:  Tiny umbilical hernia, which contains only fat. Musculoskeletal: No suspicious bone lesions identified. Left hip prosthesis noted. IMPRESSION: Distal small bowel obstruction, with transition point in the right lateral abdomen suspicious for an adhesion. Colonic diverticulosis, without radiographic evidence of diverticulitis. Minimal perihepatic ascites. Tiny hiatal hernia. Tiny umbilical hernia, which contains only fat. Electronically Signed   By: Danae Orleans M.D.   On: 02/25/2023 18:40    EKG: I independently viewed the EKG done and my findings are as followed: Sinus rhythm rate of 89.  Nonspecific ST-T changes.  QTc 460.  Assessment/Plan Present on Admission:  SBO (small bowel obstruction) (HCC)  Principal Problem:   SBO (small bowel obstruction) (HCC)  Small bowel obstruction, suspect secondary to adhesions CT abdomen pelvis without contrast revealed distal small bowel obstruction with transition point in the right lateral abdomen suspicious for an adhesion.   IV fluid hydration Optimize magnesium and potassium levels Mobilize as  tolerated General Surgery following. NG tube placed in the ED on 02/25/2023 for SBO protocol Obtain KUB in the morning  Presumptive UTI until proven otherwise, POA Endorses lower abdominal pain unclear if related to UTI or SBO Obtain urine culture Treat with Rocephin, DC if urine culture returns negative.  Essential hypertension BP is not at goal, elevated Hold off home oral antihypertensive due to SBO IV hydralazine as needed with parameters Closely monitor vital signs  Chronic anxiety Oral home medications on hold due to SBO. IV Ativan as needed  Physical debility PT OT Fall precautions.  Time: 75 minutes.   DVT prophylaxis: SQ heparin 3 times daily   Code Status: DNR.  Family Communication: Family at bedside.  Disposition Plan: Admitted to telemetry surgical unit.  Consults called: General Surgery.  Admission status: Inpatient status.   Status is: Inpatient The patient requires at least 2 midnights for further evaluation and treatment of present condition.   Darlin Drop MD [facility] Pager [phone]  If 7PM-7AM, please contact night-coverage [email or web address] Password [email or web address]  02/26/2023, 12:53 AM

## 2023-02-26 NOTE — Progress Notes (Signed)
Subjective/Chief Complaint: Patient denies abdominal pain.  She has had 3 bowel movements.   Objective: Vital signs in last 24 hours: Temp:  [98.1 F (36.7 C)-98.6 F (37 C)] 98.4 F (36.9 C) (10/05 0809) Pulse Rate:  [79-98] 86 (10/05 0809) Resp:  [10-24] 10 (10/05 0808) BP: (134-178)/(66-105) 146/66 (10/05 0809) SpO2:  [93 %-99 %] 96 % (10/05 0809) Weight:  [68.2 kg] 68.2 kg (10/04 1503) Last BM Date :  (PTA)  Intake/Output from previous day: 10/04 0701 - 10/05 0700 In: 804.1 [I.V.:204.1; IV Piggyback:600] Out: -  Intake/Output this shift: No intake/output data recorded.  General appearance: alert and cooperative GI: Soft nontender without rebound, guarding or distention  Lab Results:  Recent Labs    02/25/23 1521  WBC 17.6*  HGB 13.7  HCT 43.2  PLT 209   BMET Recent Labs    02/25/23 1521 02/25/23 1732  NA 137 137  K 5.7* 5.0  CL 101 102  CO2 23 23  GLUCOSE 182* 175*  BUN 30* 30*  CREATININE 1.86* 1.73*  CALCIUM 10.2 10.2   PT/INR No results for input(s): "LABPROT", "INR" in the last 72 hours. ABG No results for input(s): "PHART", "HCO3" in the last 72 hours.  Invalid input(s): "PCO2", "PO2"  Studies/Results: DG Abd Portable 1V-Small Bowel Protocol-Position Verification  Result Date: 02/25/2023 CLINICAL DATA:  Nasogastric tube placement. EXAM: PORTABLE ABDOMEN - 1 VIEW COMPARISON:  CT earlier today FINDINGS: Tip and side port of the enteric tube below the diaphragm in the stomach. Left upper quadrant rounded calcification is splenic on prior CT. Few prominent loops of small bowel in the abdomen or partially included in the field of view. IMPRESSION: Tip and side port of the enteric tube below the diaphragm in the stomach. Electronically Signed   By: Narda Rutherford M.D.   On: 02/25/2023 22:45   CT ABDOMEN PELVIS WO CONTRAST  Result Date: 02/25/2023 CLINICAL DATA:  Right lower quadrant pain and nausea beginning today. EXAM: CT ABDOMEN AND PELVIS  WITHOUT CONTRAST TECHNIQUE: Multidetector CT imaging of the abdomen and pelvis was performed following the standard protocol without IV contrast. RADIATION DOSE REDUCTION: This exam was performed according to the departmental dose-optimization program which includes automated exposure control, adjustment of the mA and/or kV according to patient size and/or use of iterative reconstruction technique. COMPARISON:  None Available. FINDINGS: Lower chest: No acute findings. Hepatobiliary: No mass visualized on this unenhanced exam. Minimal perihepatic ascites noted. Prior cholecystectomy. No evidence of biliary obstruction. Pancreas: No mass or inflammatory process visualized on this unenhanced exam. Spleen: Within normal limits in size. Benign calcified granuloma or cyst seen in the anterior spleen. Adrenals/Urinary tract: No evidence of urolithiasis or hydronephrosis. Unremarkable unopacified urinary bladder. Stomach/Bowel: Tiny hiatal hernia noted. Diffuse colonic diverticulosis is seen, without signs of diverticulitis. Although the appendix is not directly visualized, no inflammatory process seen in region of the cecum or elsewhere. Mildly dilated small bowel loops with air-fluid levels are seen within the abdomen and pelvis, with small bowel feces sign and transition point seen in the right lateral abdomen. No obstructing mass or inflammatory process identified, and this raises suspicion for an adhesion. Vascular/Lymphatic: No pathologically enlarged lymph nodes identified. No evidence of abdominal aortic aneurysm. Reproductive:  No mass or other significant abnormality. Other:  Tiny umbilical hernia, which contains only fat. Musculoskeletal: No suspicious bone lesions identified. Left hip prosthesis noted. IMPRESSION: Distal small bowel obstruction, with transition point in the right lateral abdomen suspicious for an adhesion. Colonic  diverticulosis, without radiographic evidence of diverticulitis. Minimal  perihepatic ascites. Tiny hiatal hernia. Tiny umbilical hernia, which contains only fat. Electronically Signed   By: Danae Orleans M.D.   On: 02/25/2023 18:40    Anti-infectives: Anti-infectives (From admission, onward)    Start     Dose/Rate Route Frequency Ordered Stop   02/27/23 1000  cefTRIAXone (ROCEPHIN) 1 g in sodium chloride 0.9 % 100 mL IVPB        1 g 200 mL/hr over 30 Minutes Intravenous Daily 02/26/23 0821     02/26/23 0500  cefTRIAXone (ROCEPHIN) 2 g in sodium chloride 0.9 % 100 mL IVPB  Status:  Discontinued        2 g 200 mL/hr over 30 Minutes Intravenous Daily 02/26/23 0413 02/26/23 0821       Assessment/Plan: Small bowel obstruction-bowels are moving, films show contrast in the colon and she has a nonacute abdomen.  Clamp NG tube for 6 hours.  If no nausea or vomiting and residuals less than 200 cc, DC NG tube and start clear liquid diet   LOS: 1 day    Dortha Schwalbe MD 02/26/2023 Moderate complexity

## 2023-02-27 DIAGNOSIS — K56609 Unspecified intestinal obstruction, unspecified as to partial versus complete obstruction: Secondary | ICD-10-CM | POA: Diagnosis not present

## 2023-02-27 LAB — GLUCOSE, CAPILLARY
Glucose-Capillary: 145 mg/dL — ABNORMAL HIGH (ref 70–99)
Glucose-Capillary: 227 mg/dL — ABNORMAL HIGH (ref 70–99)
Glucose-Capillary: 240 mg/dL — ABNORMAL HIGH (ref 70–99)
Glucose-Capillary: 97 mg/dL (ref 70–99)

## 2023-02-27 NOTE — Progress Notes (Signed)
Mobility Specialist: Progress Note   02/27/23 1224  Mobility  Activity Ambulated with assistance in hallway  Level of Assistance Standby assist, set-up cues, supervision of patient - no hands on  Assistive Device Front wheel walker  Distance Ambulated (ft) 500 ft  Activity Response Tolerated well  Mobility Referral Yes  $Mobility charge 1 Mobility  Mobility Specialist Start Time (ACUTE ONLY) 1050  Mobility Specialist Stop Time (ACUTE ONLY) 1102  Mobility Specialist Time Calculation (min) (ACUTE ONLY) 12 min    Pt was agreeable to mobility session - received in chair. Close SB throughout. Returned to room without fault. Left in chair with all needs met, call bell in reach.  Maurene Capes Mobility Specialist Please contact via SecureChat or Rehab office at [phone]

## 2023-02-27 NOTE — Discharge Summary (Signed)
Physician Discharge Summary  Haley Campbell AOZ:[id number] DOB: [birth year] DOA: 02/25/2023  PCP: Marden Noble, MD  Admit date: 02/25/2023 Discharge date: 02/27/2023  Admitted From: Home Disposition:  Home  Discharge Condition:Stable CODE STATUS:DNR Diet recommendation: Heart Healthy  Brief/Interim Summary: Patient is a 89 year old female with history of anxiety, type diabetes, hypertension, CKD stage IIIb who presented with diffuse abdominal pain yesterday with nausea but no vomiting..  CT abdomen/pelvis showed a distal small bowel obstruction with transition point in the right lateral abdomen suspicion for adhesion.    General surgery consulted.  Started on conservative management,NG tube placed.  SBO resolved with conservative management.  NG tube has been taken out.  She has been tolerating clear liquid diet.  Plan is to advance the diet to solid and discharge to  home today.  Following problems were addressed during the hospitalization:  SBO: Presented with abdominal pain, nausea.  CT abdomen/pelvis as above.  Resolved with conservative management.  No abdominal pain, nausea or vomiting today.  Tolerating diet.  Having multiple bowel movements.   Suspected UTI: No dysuria.  Presented with leukocytosis which is likely from SBO.  She got 2 doses of ceftriaxone. Will not continue antibiotics beyond.    CKD stage IIIb: Recent labs not available.  Creatinine on 07/03/2014 was 1.44.  Currently kidney function close to baseline   Leukocytosis: Could be reactive .  Resolved   Hypertension: Continue home medications  Anxiety: Continue home angiolytics   Debility/deconditioning: PT/OT consulted, no follow-up recommended  Discharge Diagnoses:  Principal Problem:   SBO (small bowel obstruction) (HCC)    Discharge Instructions  Discharge Instructions     Diet - low sodium heart healthy   Complete by: As directed    Discharge instructions   Complete by: As directed    1)Please  follow-up with your PCP in a week 2)Take prescribed medication as instructed   Increase activity slowly   Complete by: As directed       Allergies as of 02/27/2023       Reactions   Latex Other (See Comments)   Severe mucosal allergy   Codeine Nausea Only   Colchicine Diarrhea, Nausea And Vomiting   Hctz [hydrochlorothiazide] Nausea Only, Other (See Comments)   Fever and Headache   Meperidine    Other reaction(s): RASH   Sonata [zaleplon] Nausea Only, Other (See Comments)   No sleep   Sulfa Antibiotics Rash        Medication List     TAKE these medications    allopurinol 100 MG tablet Commonly known as: ZYLOPRIM Take 100 mg by mouth daily.   ALPRAZolam 0.5 MG tablet Commonly known as: XANAX Take 2 tablets by mouth at bedtime.   amLODipine 5 MG tablet Commonly known as: NORVASC Take 5 mg by mouth daily.   glucose blood test strip 1 each by Other route as needed for other. Use as instructed   ibuprofen 200 MG tablet Commonly known as: ADVIL Take 2 tablets (400 mg total) by mouth every 6 (six) hours as needed for fever or mild pain.   traMADol 50 MG tablet Commonly known as: ULTRAM Take 50 mg by mouth 3 (three) times daily as needed for moderate pain.   VITAMIN D3 EXTRA STRENGTH PO Take 1 tablet by mouth daily.   zolpidem 10 MG tablet Commonly known as: AMBIEN Take 10 mg by mouth at bedtime as needed for sleep.        Follow-up Information     Kevan Ny,  Molly Maduro, MD. Schedule an appointment as soon as possible for a visit in 1 week(s).   Specialty: Internal Medicine Contact information: [address] [place] [address] [phone]                Allergies  Allergen Reactions   Latex Other (See Comments)    Severe mucosal allergy   Codeine Nausea Only   Colchicine Diarrhea and Nausea And Vomiting   Hctz [Hydrochlorothiazide] Nausea Only and Other (See Comments)    Fever and Headache   Meperidine     Other reaction(s):  RASH   Sonata [Zaleplon] Nausea Only and Other (See Comments)    No sleep   Sulfa Antibiotics Rash    Consultations: General surgery   Procedures/Studies: DG Abd Portable 1V-Small Bowel Obstruction Protocol-initial, 8 hr delay  Result Date: 02/26/2023 CLINICAL DATA:  Small bowel obstruction, follow-up film EXAM: PORTABLE ABDOMEN - 1 VIEW COMPARISON:  CT and radiograph from yesterday FINDINGS: Enteric contrast is seen within nondilated colon. Contrast reaches the rectum. Colonic diverticulosis. No detected small bowel dilatation. An enteric tube tip is at the stomach with side port near the GE junction. Lung bases are clear. Elevated right diaphragm. IMPRESSION: Enteric contrast has reached the nondilated colon. No visible small bowel distension. Electronically Signed   By: Tiburcio Pea M.D.   On: 02/26/2023 08:57   DG Abd Portable 1V-Small Bowel Protocol-Position Verification  Result Date: 02/25/2023 CLINICAL DATA:  Nasogastric tube placement. EXAM: PORTABLE ABDOMEN - 1 VIEW COMPARISON:  CT earlier today FINDINGS: Tip and side port of the enteric tube below the diaphragm in the stomach. Left upper quadrant rounded calcification is splenic on prior CT. Few prominent loops of small bowel in the abdomen or partially included in the field of view. IMPRESSION: Tip and side port of the enteric tube below the diaphragm in the stomach. Electronically Signed   By: Narda Rutherford M.D.   On: 02/25/2023 22:45   CT ABDOMEN PELVIS WO CONTRAST  Result Date: 02/25/2023 CLINICAL DATA:  Right lower quadrant pain and nausea beginning today. EXAM: CT ABDOMEN AND PELVIS WITHOUT CONTRAST TECHNIQUE: Multidetector CT imaging of the abdomen and pelvis was performed following the standard protocol without IV contrast. RADIATION DOSE REDUCTION: This exam was performed according to the departmental dose-optimization program which includes automated exposure control, adjustment of the mA and/or kV according to patient  size and/or use of iterative reconstruction technique. COMPARISON:  None Available. FINDINGS: Lower chest: No acute findings. Hepatobiliary: No mass visualized on this unenhanced exam. Minimal perihepatic ascites noted. Prior cholecystectomy. No evidence of biliary obstruction. Pancreas: No mass or inflammatory process visualized on this unenhanced exam. Spleen: Within normal limits in size. Benign calcified granuloma or cyst seen in the anterior spleen. Adrenals/Urinary tract: No evidence of urolithiasis or hydronephrosis. Unremarkable unopacified urinary bladder. Stomach/Bowel: Tiny hiatal hernia noted. Diffuse colonic diverticulosis is seen, without signs of diverticulitis. Although the appendix is not directly visualized, no inflammatory process seen in region of the cecum or elsewhere. Mildly dilated small bowel loops with air-fluid levels are seen within the abdomen and pelvis, with small bowel feces sign and transition point seen in the right lateral abdomen. No obstructing mass or inflammatory process identified, and this raises suspicion for an adhesion. Vascular/Lymphatic: No pathologically enlarged lymph nodes identified. No evidence of abdominal aortic aneurysm. Reproductive:  No mass or other significant abnormality. Other:  Tiny umbilical hernia, which contains only fat. Musculoskeletal: No suspicious bone lesions identified. Left hip prosthesis  noted. IMPRESSION: Distal small bowel obstruction, with transition point in the right lateral abdomen suspicious for an adhesion. Colonic diverticulosis, without radiographic evidence of diverticulitis. Minimal perihepatic ascites. Tiny hiatal hernia. Tiny umbilical hernia, which contains only fat. Electronically Signed   By: Danae Orleans M.D.   On: 02/25/2023 18:40      Subjective: Patient seen and examined at bedside today.  Very comfortable this morning.  NG tube has been removed yesterday.  No nausea, vomiting.  Having multiple bowel movements.   Abdomen soft, nondistended with good bowel sounds this morning.  Eager to go home.  Discharge plan discussed with son at bedside  Discharge Exam: Vitals:   02/27/23 0433 02/27/23 0841  BP: (!) 145/64 (!) 156/75  Pulse: 78 78  Resp: 14 18  Temp:  98 F (36.7 C)  SpO2: 93% 96%   Vitals:   02/26/23 1719 02/26/23 2009 02/27/23 0433 02/27/23 0841  BP: (!) 150/73 (!) 154/71 (!) 145/64 (!) 156/75  Pulse: 88 78 78 78  Resp: 20 16 14 18   Temp: 98 F (36.7 C) 98.2 F (36.8 C)  98 F (36.7 C)  TempSrc: Oral     SpO2:  94% 93% 96%  Weight:      Height:        General: Pt is alert, awake, not in acute distress Cardiovascular: RRR, S1/S2 +, no rubs, no gallops Respiratory: CTA bilaterally, no wheezing, no rhonchi Abdominal: Soft, NT, ND, bowel sounds + Extremities: no edema, no cyanosis    The results of significant diagnostics from this hospitalization (including imaging, microbiology, ancillary and laboratory) are listed below for reference.     Microbiology: No results found for this or any previous visit (from the past 240 hour(s)).   Labs: BNP (last 3 results) No results for input(s): "BNP" in the last 8760 hours. Basic Metabolic Panel: Recent Labs  Lab 02/25/23 1521 02/25/23 1732 02/26/23 1049  NA 137 137 140  K 5.7* 5.0 4.0  CL 101 102 105  CO2 23 23 24   GLUCOSE 182* 175* 163*  BUN 30* 30* 22  CREATININE 1.86* 1.73* 1.69*  CALCIUM 10.2 10.2 9.5  MG  --   --  1.9  PHOS  --   --  3.6   Liver Function Tests: Recent Labs  Lab 02/25/23 1521  AST 31  ALT 13  ALKPHOS 76  BILITOT 1.1  PROT 7.8  ALBUMIN 4.0   Recent Labs  Lab 02/25/23 1521  LIPASE 56*   No results for input(s): "AMMONIA" in the last 168 hours. CBC: Recent Labs  Lab 02/25/23 1521 02/26/23 1049  WBC 17.6* 9.3  HGB 13.7 12.7  HCT 43.2 39.8  MCV 86.6 87.9  PLT 209 180   Cardiac Enzymes: No results for input(s): "CKTOTAL", "CKMB", "CKMBINDEX", "TROPONINI" in the last 168  hours. BNP: Invalid input(s): "POCBNP" CBG: Recent Labs  Lab 02/26/23 1647 02/26/23 2007 02/27/23 0002 02/27/23 0431 02/27/23 0848  GLUCAP 184* 144* 97 145* 227*   D-Dimer No results for input(s): "DDIMER" in the last 72 hours. Hgb A1c No results for input(s): "HGBA1C" in the last 72 hours. Lipid Profile No results for input(s): "CHOL", "HDL", "LDLCALC", "TRIG", "CHOLHDL", "LDLDIRECT" in the last 72 hours. Thyroid function studies No results for input(s): "TSH", "T4TOTAL", "T3FREE", "THYROIDAB" in the last 72 hours.  Invalid input(s): "FREET3" Anemia work up No results for input(s): "VITAMINB12", "FOLATE", "FERRITIN", "TIBC", "IRON", "RETICCTPCT" in the last 72 hours. Urinalysis    Component Value Date/Time  COLORURINE YELLOW 02/25/2023 1521   APPEARANCEUR CLEAR 02/25/2023 1521   LABSPEC 1.013 02/25/2023 1521   PHURINE 6.0 02/25/2023 1521   GLUCOSEU NEGATIVE 02/25/2023 1521   HGBUR NEGATIVE 02/25/2023 1521   BILIRUBINUR NEGATIVE 02/25/2023 1521   KETONESUR NEGATIVE 02/25/2023 1521   PROTEINUR 100 (A) 02/25/2023 1521   UROBILINOGEN 0.2 06/21/2014 1528   NITRITE NEGATIVE 02/25/2023 1521   LEUKOCYTESUR TRACE (A) 02/25/2023 1521   Sepsis Labs Recent Labs  Lab 02/25/23 1521 02/26/23 1049  WBC 17.6* 9.3   Microbiology No results found for this or any previous visit (from the past 240 hour(s)).  Please note: You were cared for by a hospitalist during your hospital stay. Once you are discharged, your primary care physician will handle any further medical issues. Please note that NO REFILLS for any discharge medications will be authorized once you are discharged, as it is imperative that you return to your primary care physician (or establish a relationship with a primary care physician if you do not have one) for your post hospital discharge needs so that they can reassess your need for medications and monitor your lab values.    Time coordinating discharge: 40  minutes  SIGNED:   Burnadette Pop, MD  [facility] 02/27/2023, 11:53 AM Pager [phone]  If 7PM-7AM, please contact night-coverage [email or web address] Password [email or web address]

## 2023-02-27 NOTE — Progress Notes (Signed)
Subjective/Chief Complaint: Patient denies abdominal pain.  Having mutliple BM now. Tolerating liquids, no n/v. Feels great. Son at bedside   Objective: Vital signs in last 24 hours: Temp:  [98 F (36.7 C)-98.2 F (36.8 C)] 98 F (36.7 C) (10/06 0841) Pulse Rate:  [78-88] 78 (10/06 0841) Resp:  [14-20] 18 (10/06 0841) BP: (145-156)/(64-75) 156/75 (10/06 0841) SpO2:  [93 %-96 %] 96 % (10/06 0841) Last BM Date : 02/26/23  Intake/Output from previous day: 10/05 0701 - 10/06 0700 In: 352 [I.V.:352] Out: 250 [Emesis/NG output:250] Intake/Output this shift: Total I/O In: 240 [P.O.:240] Out: -   General appearance: alert and cooperative GI: Soft nontender without rebound, guarding or distention  Lab Results:  Recent Labs    02/25/23 1521 02/26/23 1049  WBC 17.6* 9.3  HGB 13.7 12.7  HCT 43.2 39.8  PLT 209 180   BMET Recent Labs    02/25/23 1732 02/26/23 1049  NA 137 140  K 5.0 4.0  CL 102 105  CO2 23 24  GLUCOSE 175* 163*  BUN 30* 22  CREATININE 1.73* 1.69*  CALCIUM 10.2 9.5   PT/INR No results for input(s): "LABPROT", "INR" in the last 72 hours. ABG No results for input(s): "PHART", "HCO3" in the last 72 hours.  Invalid input(s): "PCO2", "PO2"  Studies/Results: DG Abd Portable 1V-Small Bowel Obstruction Protocol-initial, 8 hr delay  Result Date: 02/26/2023 CLINICAL DATA:  Small bowel obstruction, follow-up film EXAM: PORTABLE ABDOMEN - 1 VIEW COMPARISON:  CT and radiograph from yesterday FINDINGS: Enteric contrast is seen within nondilated colon. Contrast reaches the rectum. Colonic diverticulosis. No detected small bowel dilatation. An enteric tube tip is at the stomach with side port near the GE junction. Lung bases are clear. Elevated right diaphragm. IMPRESSION: Enteric contrast has reached the nondilated colon. No visible small bowel distension. Electronically Signed   By: Tiburcio Pea M.D.   On: 02/26/2023 08:57   DG Abd Portable 1V-Small Bowel  Protocol-Position Verification  Result Date: 02/25/2023 CLINICAL DATA:  Nasogastric tube placement. EXAM: PORTABLE ABDOMEN - 1 VIEW COMPARISON:  CT earlier today FINDINGS: Tip and side port of the enteric tube below the diaphragm in the stomach. Left upper quadrant rounded calcification is splenic on prior CT. Few prominent loops of small bowel in the abdomen or partially included in the field of view. IMPRESSION: Tip and side port of the enteric tube below the diaphragm in the stomach. Electronically Signed   By: Narda Rutherford M.D.   On: 02/25/2023 22:45   CT ABDOMEN PELVIS WO CONTRAST  Result Date: 02/25/2023 CLINICAL DATA:  Right lower quadrant pain and nausea beginning today. EXAM: CT ABDOMEN AND PELVIS WITHOUT CONTRAST TECHNIQUE: Multidetector CT imaging of the abdomen and pelvis was performed following the standard protocol without IV contrast. RADIATION DOSE REDUCTION: This exam was performed according to the departmental dose-optimization program which includes automated exposure control, adjustment of the mA and/or kV according to patient size and/or use of iterative reconstruction technique. COMPARISON:  None Available. FINDINGS: Lower chest: No acute findings. Hepatobiliary: No mass visualized on this unenhanced exam. Minimal perihepatic ascites noted. Prior cholecystectomy. No evidence of biliary obstruction. Pancreas: No mass or inflammatory process visualized on this unenhanced exam. Spleen: Within normal limits in size. Benign calcified granuloma or cyst seen in the anterior spleen. Adrenals/Urinary tract: No evidence of urolithiasis or hydronephrosis. Unremarkable unopacified urinary bladder. Stomach/Bowel: Tiny hiatal hernia noted. Diffuse colonic diverticulosis is seen, without signs of diverticulitis. Although the appendix is not directly visualized,  no inflammatory process seen in region of the cecum or elsewhere. Mildly dilated small bowel loops with air-fluid levels are seen within the  abdomen and pelvis, with small bowel feces sign and transition point seen in the right lateral abdomen. No obstructing mass or inflammatory process identified, and this raises suspicion for an adhesion. Vascular/Lymphatic: No pathologically enlarged lymph nodes identified. No evidence of abdominal aortic aneurysm. Reproductive:  No mass or other significant abnormality. Other:  Tiny umbilical hernia, which contains only fat. Musculoskeletal: No suspicious bone lesions identified. Left hip prosthesis noted. IMPRESSION: Distal small bowel obstruction, with transition point in the right lateral abdomen suspicious for an adhesion. Colonic diverticulosis, without radiographic evidence of diverticulitis. Minimal perihepatic ascites. Tiny hiatal hernia. Tiny umbilical hernia, which contains only fat. Electronically Signed   By: Danae Orleans M.D.   On: 02/25/2023 18:40    Anti-infectives: Anti-infectives (From admission, onward)    Start     Dose/Rate Route Frequency Ordered Stop   02/27/23 1000  cefTRIAXone (ROCEPHIN) 1 g in sodium chloride 0.9 % 100 mL IVPB        1 g 200 mL/hr over 30 Minutes Intravenous Daily 02/26/23 0821     02/26/23 0500  cefTRIAXone (ROCEPHIN) 2 g in sodium chloride 0.9 % 100 mL IVPB  Status:  Discontinued        2 g 200 mL/hr over 30 Minutes Intravenous Daily 02/26/23 0413 02/26/23 0821       Assessment/Plan: Small bowel obstruction clinically appears to be resolved - ok for reg diet. Ok for d/c later today if reliably tolerating solid foods  I spent a total of 35 minutes in both face-to-face and non-face-to-face activities, excluding procedures performed, for this visit on the date of this encounter.    LOS: 2 days    Andria Meuse MD 02/27/2023 Moderate complexity

## 2023-02-27 NOTE — Progress Notes (Signed)
Discharge teaching complete. Meds, diet, activity, follow up appointments reviewed and all questions answered. Copy of instructions given to patient, son at bedside.

## 2023-02-28 LAB — URINE CULTURE: Culture: <10000 — AB

## 2023-03-07 DIAGNOSIS — K56609 Unspecified intestinal obstruction, unspecified as to partial versus complete obstruction: Secondary | ICD-10-CM | POA: Diagnosis not present

## 2023-03-07 DIAGNOSIS — R7309 Other abnormal glucose: Secondary | ICD-10-CM | POA: Diagnosis not present

## 2023-03-07 DIAGNOSIS — B372 Candidiasis of skin and nail: Secondary | ICD-10-CM | POA: Diagnosis not present

## 2023-03-11 DIAGNOSIS — Z23 Encounter for immunization: Secondary | ICD-10-CM | POA: Diagnosis not present

## 2023-04-14 ENCOUNTER — Encounter (INDEPENDENT_AMBULATORY_CARE_PROVIDER_SITE_OTHER): Payer: Medicare Other | Admitting: Ophthalmology

## 2023-04-14 DIAGNOSIS — H353134 Nonexudative age-related macular degeneration, bilateral, advanced atrophic with subfoveal involvement: Secondary | ICD-10-CM

## 2023-04-14 DIAGNOSIS — H35033 Hypertensive retinopathy, bilateral: Secondary | ICD-10-CM | POA: Diagnosis not present

## 2023-04-14 DIAGNOSIS — H43813 Vitreous degeneration, bilateral: Secondary | ICD-10-CM

## 2023-04-14 DIAGNOSIS — I10 Essential (primary) hypertension: Secondary | ICD-10-CM | POA: Diagnosis not present

## 2023-05-12 DIAGNOSIS — Z0000 Encounter for general adult medical examination without abnormal findings: Secondary | ICD-10-CM | POA: Diagnosis not present

## 2023-05-12 DIAGNOSIS — M109 Gout, unspecified: Secondary | ICD-10-CM | POA: Diagnosis not present

## 2023-05-12 DIAGNOSIS — G4700 Insomnia, unspecified: Secondary | ICD-10-CM | POA: Diagnosis not present

## 2023-05-12 DIAGNOSIS — I10 Essential (primary) hypertension: Secondary | ICD-10-CM | POA: Diagnosis not present

## 2023-05-12 DIAGNOSIS — H9312 Tinnitus, left ear: Secondary | ICD-10-CM | POA: Diagnosis not present

## 2023-05-12 DIAGNOSIS — M179 Osteoarthritis of knee, unspecified: Secondary | ICD-10-CM | POA: Diagnosis not present

## 2023-05-12 DIAGNOSIS — D649 Anemia, unspecified: Secondary | ICD-10-CM | POA: Diagnosis not present

## 2023-05-12 DIAGNOSIS — E119 Type 2 diabetes mellitus without complications: Secondary | ICD-10-CM | POA: Diagnosis not present

## 2023-05-12 DIAGNOSIS — E559 Vitamin D deficiency, unspecified: Secondary | ICD-10-CM | POA: Diagnosis not present

## 2023-05-12 DIAGNOSIS — E538 Deficiency of other specified B group vitamins: Secondary | ICD-10-CM | POA: Diagnosis not present

## 2023-05-12 DIAGNOSIS — F419 Anxiety disorder, unspecified: Secondary | ICD-10-CM | POA: Diagnosis not present

## 2023-07-18 DIAGNOSIS — E119 Type 2 diabetes mellitus without complications: Secondary | ICD-10-CM | POA: Diagnosis not present

## 2023-07-18 DIAGNOSIS — E538 Deficiency of other specified B group vitamins: Secondary | ICD-10-CM | POA: Diagnosis not present

## 2023-07-18 DIAGNOSIS — M109 Gout, unspecified: Secondary | ICD-10-CM | POA: Diagnosis not present

## 2023-07-18 DIAGNOSIS — I10 Essential (primary) hypertension: Secondary | ICD-10-CM | POA: Diagnosis not present

## 2023-07-18 DIAGNOSIS — E559 Vitamin D deficiency, unspecified: Secondary | ICD-10-CM | POA: Diagnosis not present

## 2023-07-19 DIAGNOSIS — H353131 Nonexudative age-related macular degeneration, bilateral, early dry stage: Secondary | ICD-10-CM | POA: Diagnosis not present

## 2023-07-19 DIAGNOSIS — H401133 Primary open-angle glaucoma, bilateral, severe stage: Secondary | ICD-10-CM | POA: Diagnosis not present

## 2023-07-19 DIAGNOSIS — E119 Type 2 diabetes mellitus without complications: Secondary | ICD-10-CM | POA: Diagnosis not present

## 2023-07-19 DIAGNOSIS — H35033 Hypertensive retinopathy, bilateral: Secondary | ICD-10-CM | POA: Diagnosis not present

## 2023-07-19 DIAGNOSIS — Z961 Presence of intraocular lens: Secondary | ICD-10-CM | POA: Diagnosis not present

## 2023-07-19 DIAGNOSIS — H43393 Other vitreous opacities, bilateral: Secondary | ICD-10-CM | POA: Diagnosis not present

## 2023-07-19 DIAGNOSIS — H04123 Dry eye syndrome of bilateral lacrimal glands: Secondary | ICD-10-CM | POA: Diagnosis not present

## 2023-07-29 DIAGNOSIS — H6121 Impacted cerumen, right ear: Secondary | ICD-10-CM | POA: Diagnosis not present

## 2023-07-29 DIAGNOSIS — T162XXA Foreign body in left ear, initial encounter: Secondary | ICD-10-CM | POA: Diagnosis not present

## 2023-11-10 DIAGNOSIS — E119 Type 2 diabetes mellitus without complications: Secondary | ICD-10-CM | POA: Diagnosis not present

## 2023-11-10 DIAGNOSIS — Z79899 Other long term (current) drug therapy: Secondary | ICD-10-CM | POA: Diagnosis not present

## 2023-11-10 DIAGNOSIS — N184 Chronic kidney disease, stage 4 (severe): Secondary | ICD-10-CM | POA: Diagnosis not present

## 2023-11-10 DIAGNOSIS — I10 Essential (primary) hypertension: Secondary | ICD-10-CM | POA: Diagnosis not present

## 2023-11-21 DIAGNOSIS — S51011A Laceration without foreign body of right elbow, initial encounter: Secondary | ICD-10-CM | POA: Diagnosis not present

## 2023-11-21 DIAGNOSIS — S01111A Laceration without foreign body of right eyelid and periocular area, initial encounter: Secondary | ICD-10-CM | POA: Diagnosis not present

## 2023-11-21 DIAGNOSIS — I6782 Cerebral ischemia: Secondary | ICD-10-CM | POA: Diagnosis not present

## 2023-11-21 DIAGNOSIS — S60042A Contusion of left ring finger without damage to nail, initial encounter: Secondary | ICD-10-CM | POA: Diagnosis not present

## 2023-11-21 DIAGNOSIS — I10 Essential (primary) hypertension: Secondary | ICD-10-CM | POA: Diagnosis not present

## 2023-11-21 DIAGNOSIS — S6992XA Unspecified injury of left wrist, hand and finger(s), initial encounter: Secondary | ICD-10-CM | POA: Diagnosis not present

## 2023-11-21 DIAGNOSIS — S81011A Laceration without foreign body, right knee, initial encounter: Secondary | ICD-10-CM | POA: Diagnosis not present

## 2023-11-21 DIAGNOSIS — S0181XA Laceration without foreign body of other part of head, initial encounter: Secondary | ICD-10-CM | POA: Diagnosis not present

## 2023-11-21 DIAGNOSIS — S6991XA Unspecified injury of right wrist, hand and finger(s), initial encounter: Secondary | ICD-10-CM | POA: Diagnosis not present

## 2023-11-21 DIAGNOSIS — S8991XA Unspecified injury of right lower leg, initial encounter: Secondary | ICD-10-CM | POA: Diagnosis not present

## 2023-11-21 DIAGNOSIS — S59902A Unspecified injury of left elbow, initial encounter: Secondary | ICD-10-CM | POA: Diagnosis not present

## 2023-11-21 DIAGNOSIS — S62001A Unspecified fracture of navicular [scaphoid] bone of right wrist, initial encounter for closed fracture: Secondary | ICD-10-CM | POA: Diagnosis not present

## 2023-11-21 DIAGNOSIS — S0990XA Unspecified injury of head, initial encounter: Secondary | ICD-10-CM | POA: Diagnosis not present

## 2023-11-21 DIAGNOSIS — E119 Type 2 diabetes mellitus without complications: Secondary | ICD-10-CM | POA: Diagnosis not present

## 2023-11-21 DIAGNOSIS — T161XXA Foreign body in right ear, initial encounter: Secondary | ICD-10-CM | POA: Diagnosis not present

## 2023-11-22 DIAGNOSIS — S0990XA Unspecified injury of head, initial encounter: Secondary | ICD-10-CM | POA: Diagnosis not present

## 2023-11-22 DIAGNOSIS — S62001A Unspecified fracture of navicular [scaphoid] bone of right wrist, initial encounter for closed fracture: Secondary | ICD-10-CM | POA: Diagnosis not present

## 2023-11-22 DIAGNOSIS — S51011A Laceration without foreign body of right elbow, initial encounter: Secondary | ICD-10-CM | POA: Diagnosis not present

## 2023-11-22 DIAGNOSIS — S81011A Laceration without foreign body, right knee, initial encounter: Secondary | ICD-10-CM | POA: Diagnosis not present

## 2023-11-22 DIAGNOSIS — S59902A Unspecified injury of left elbow, initial encounter: Secondary | ICD-10-CM | POA: Diagnosis not present

## 2023-11-22 DIAGNOSIS — I10 Essential (primary) hypertension: Secondary | ICD-10-CM | POA: Diagnosis not present

## 2023-11-22 DIAGNOSIS — S0181XA Laceration without foreign body of other part of head, initial encounter: Secondary | ICD-10-CM | POA: Diagnosis not present

## 2023-11-22 DIAGNOSIS — T161XXA Foreign body in right ear, initial encounter: Secondary | ICD-10-CM | POA: Diagnosis not present

## 2023-11-22 DIAGNOSIS — S6992XA Unspecified injury of left wrist, hand and finger(s), initial encounter: Secondary | ICD-10-CM | POA: Diagnosis not present

## 2023-11-28 DIAGNOSIS — S0181XA Laceration without foreign body of other part of head, initial encounter: Secondary | ICD-10-CM | POA: Diagnosis not present

## 2023-11-28 DIAGNOSIS — Z4802 Encounter for removal of sutures: Secondary | ICD-10-CM | POA: Diagnosis not present

## 2023-11-28 DIAGNOSIS — L0390 Cellulitis, unspecified: Secondary | ICD-10-CM | POA: Diagnosis not present

## 2024-01-16 DIAGNOSIS — H35033 Hypertensive retinopathy, bilateral: Secondary | ICD-10-CM | POA: Diagnosis not present

## 2024-01-16 DIAGNOSIS — E119 Type 2 diabetes mellitus without complications: Secondary | ICD-10-CM | POA: Diagnosis not present

## 2024-01-16 DIAGNOSIS — H04123 Dry eye syndrome of bilateral lacrimal glands: Secondary | ICD-10-CM | POA: Diagnosis not present

## 2024-01-16 DIAGNOSIS — H43393 Other vitreous opacities, bilateral: Secondary | ICD-10-CM | POA: Diagnosis not present

## 2024-01-16 DIAGNOSIS — H401133 Primary open-angle glaucoma, bilateral, severe stage: Secondary | ICD-10-CM | POA: Diagnosis not present

## 2024-01-30 DIAGNOSIS — I10 Essential (primary) hypertension: Secondary | ICD-10-CM | POA: Diagnosis not present

## 2024-01-30 DIAGNOSIS — E119 Type 2 diabetes mellitus without complications: Secondary | ICD-10-CM | POA: Diagnosis not present

## 2024-01-30 DIAGNOSIS — H9312 Tinnitus, left ear: Secondary | ICD-10-CM | POA: Diagnosis not present

## 2024-03-02 DIAGNOSIS — Z23 Encounter for immunization: Secondary | ICD-10-CM | POA: Diagnosis not present

## 2024-04-12 ENCOUNTER — Encounter (INDEPENDENT_AMBULATORY_CARE_PROVIDER_SITE_OTHER): Payer: Medicare Other | Admitting: Ophthalmology

## 2024-06-12 ENCOUNTER — Encounter (INDEPENDENT_AMBULATORY_CARE_PROVIDER_SITE_OTHER): Admitting: Ophthalmology

## 2024-06-12 DIAGNOSIS — H35033 Hypertensive retinopathy, bilateral: Secondary | ICD-10-CM | POA: Diagnosis not present

## 2024-06-12 DIAGNOSIS — I10 Essential (primary) hypertension: Secondary | ICD-10-CM

## 2024-06-12 DIAGNOSIS — H353134 Nonexudative age-related macular degeneration, bilateral, advanced atrophic with subfoveal involvement: Secondary | ICD-10-CM | POA: Diagnosis not present

## 2024-06-12 DIAGNOSIS — H43813 Vitreous degeneration, bilateral: Secondary | ICD-10-CM | POA: Diagnosis not present
